# Patient Record
Sex: Female | Born: 1950
Health system: Southern US, Community
[De-identification: ages and names within clinical notes are randomized; demographics above are authoritative.]

## PROBLEM LIST (undated history)

## (undated) DIAGNOSIS — I10 Essential (primary) hypertension: Secondary | ICD-10-CM

## (undated) DIAGNOSIS — M8580 Other specified disorders of bone density and structure, unspecified site: Secondary | ICD-10-CM

## (undated) DIAGNOSIS — E559 Vitamin D deficiency, unspecified: Secondary | ICD-10-CM

## (undated) HISTORY — DX: Vitamin D deficiency, unspecified: E55.9

## (undated) HISTORY — DX: Essential (primary) hypertension: I10

## (undated) HISTORY — DX: Other specified disorders of bone density and structure, unspecified site: M85.80

## (undated) HISTORY — PX: CYST REMOVAL NECK: SHX6281

---

## 1977-02-07 HISTORY — PX: TUBAL LIGATION: SHX77

## 2019-05-08 DIAGNOSIS — E039 Hypothyroidism, unspecified: Secondary | ICD-10-CM | POA: Diagnosis not present

## 2019-05-08 DIAGNOSIS — Z789 Other specified health status: Secondary | ICD-10-CM | POA: Diagnosis not present

## 2019-05-08 DIAGNOSIS — I10 Essential (primary) hypertension: Secondary | ICD-10-CM | POA: Diagnosis not present

## 2019-05-08 DIAGNOSIS — Z299 Encounter for prophylactic measures, unspecified: Secondary | ICD-10-CM | POA: Diagnosis not present

## 2019-07-12 DIAGNOSIS — E039 Hypothyroidism, unspecified: Secondary | ICD-10-CM | POA: Diagnosis not present

## 2019-08-09 DIAGNOSIS — Z299 Encounter for prophylactic measures, unspecified: Secondary | ICD-10-CM | POA: Diagnosis not present

## 2019-08-09 DIAGNOSIS — E0590 Thyrotoxicosis, unspecified without thyrotoxic crisis or storm: Secondary | ICD-10-CM | POA: Diagnosis not present

## 2019-08-09 DIAGNOSIS — I10 Essential (primary) hypertension: Secondary | ICD-10-CM | POA: Diagnosis not present

## 2019-08-09 DIAGNOSIS — M8580 Other specified disorders of bone density and structure, unspecified site: Secondary | ICD-10-CM | POA: Diagnosis not present

## 2019-08-12 DIAGNOSIS — E042 Nontoxic multinodular goiter: Secondary | ICD-10-CM | POA: Diagnosis not present

## 2019-08-12 DIAGNOSIS — E0520 Thyrotoxicosis with toxic multinodular goiter without thyrotoxic crisis or storm: Secondary | ICD-10-CM | POA: Diagnosis not present

## 2019-08-23 DIAGNOSIS — I10 Essential (primary) hypertension: Secondary | ICD-10-CM | POA: Diagnosis not present

## 2019-08-23 DIAGNOSIS — E039 Hypothyroidism, unspecified: Secondary | ICD-10-CM | POA: Diagnosis not present

## 2019-08-23 DIAGNOSIS — E041 Nontoxic single thyroid nodule: Secondary | ICD-10-CM | POA: Diagnosis not present

## 2019-08-23 DIAGNOSIS — Z299 Encounter for prophylactic measures, unspecified: Secondary | ICD-10-CM | POA: Diagnosis not present

## 2019-09-25 ENCOUNTER — Ambulatory Visit: Payer: Medicare Other | Admitting: Nurse Practitioner

## 2019-09-25 ENCOUNTER — Other Ambulatory Visit: Payer: Self-pay

## 2019-09-25 ENCOUNTER — Encounter: Payer: Self-pay | Admitting: Nurse Practitioner

## 2019-09-25 VITALS — BP 134/87 | HR 77 | Ht 65.0 in | Wt 204.8 lb

## 2019-09-25 DIAGNOSIS — E042 Nontoxic multinodular goiter: Secondary | ICD-10-CM | POA: Diagnosis not present

## 2019-09-25 NOTE — Patient Instructions (Signed)
Thyroid Nodule  A thyroid nodule is an isolated growth of thyroid cells that forms a lump in your thyroid gland. The thyroid gland is a butterfly-shaped gland. It is found in the lower front of your neck. This gland sends chemical messengers (hormones) through your blood to all parts of your body. These hormones are important in regulating your body temperature and helping your body to use energy. Thyroid nodules are common. Most are not cancerous (benign). You may have one nodule or several nodules. Different types of thyroid nodules include nodules that:  Grow and fill with fluid (thyroid cysts).  Produce too much thyroid hormone (hot nodules or hyperthyroid).  Produce no thyroid hormone (cold nodules or hypothyroid).  Form from cancer cells (thyroid cancers). What are the causes? In most cases, the cause of this condition is not known. What increases the risk? The following factors may make you more likely to develop this condition.  Age. Thyroid nodules become more common in people who are older than 69 years of age.  Gender. ? Benign thyroid nodules are more common in women. ? Cancerous (malignant) thyroid nodules are more common in men.  A family history that includes: ? Thyroid nodules. ? Pheochromocytoma. ? Thyroid carcinoma. ? Hyperparathyroidism.  Certain kinds of thyroid diseases, such as Hashimoto's thyroiditis.  Lack of iodine in your diet.  A history of head and neck radiation, such as from previous cancer treatment. What are the signs or symptoms? In many cases, there are no symptoms. If you have symptoms, they may include:  A lump in your lower neck.  Feeling a lump or tickle in your throat.  Pain in your neck, jaw, or ear.  Having trouble swallowing. Hot nodules may cause symptoms that include:  Weight loss.  Warm, flushed skin.  Feeling hot.  Feeling nervous.  A racing heartbeat. Cold nodules may cause symptoms that include:  Weight  gain.  Dry skin.  Brittle hair. This may also occur with hair loss.  Feeling cold.  Fatigue. Thyroid cancer nodules may cause symptoms that include:  Hard nodules that feel stuck to the thyroid gland.  Hoarseness.  Lumps in the glands near your thyroid (lymph nodes). How is this diagnosed? A thyroid nodule may be felt by your health care provider during a physical exam. This condition may also be diagnosed based on your symptoms. You may also have tests, including:  An ultrasound. This may be done to confirm the diagnosis.  A biopsy. This involves taking a sample from the nodule and looking at it under a microscope.  Blood tests to make sure that your thyroid is working properly.  A thyroid scan. This test uses a radioactive tracer injected into a vein to create an image of the thyroid gland on a computer screen.  Imaging tests such as MRI or CT scan. These may be done if: ? Your nodule is large. ? Your nodule is blocking your airway. ? Cancer is suspected. How is this treated? Treatment depends on the cause and size of your nodule or nodules. If the nodule is benign, treatment may not be necessary. Your health care provider may monitor the nodule to see if it goes away without treatment. If the nodule continues to grow, is cancerous, or does not go away, treatment may be needed. Treatment may include:  Having a cystic nodule drained with a needle.  Ablation therapy. In this treatment, alcohol is injected into the area of the nodule to destroy the cells. Ablation with heat (  thermal ablation) may also be used.  Radioactive iodine. In this treatment, radioactive iodine is given as a pill or liquid that you drink. This substance causes the thyroid nodule to shrink.  Surgery to remove the nodule. Part or all of your thyroid gland may need to be removed as well.  Medicines. Follow these instructions at home:  Pay attention to any changes in your nodule.  Take  over-the-counter and prescription medicines only as told by your health care provider.  Keep all follow-up visits as told by your health care provider. This is important. Contact a health care provider if:  Your voice changes.  You have trouble swallowing.  You have pain in your neck, ear, or jaw that is getting worse.  Your nodule gets bigger.  Your nodule starts to make it harder for you to breathe.  Your muscles look like they are shrinking (muscle wasting). Get help right away if:  You have chest pain.  There is a loss of consciousness.  You have a sudden fever.  You feel confused.  You are seeing or hearing things that other people do not see or hear (having hallucinations).  You feel very weak.  You have mood swings.  You feel very restless.  You feel suddenly nauseous or throw up.  You suddenly have diarrhea. Summary  A thyroid nodule is an isolated growth of thyroid cells that forms a lump in your thyroid gland.  Thyroid nodules are common. Most are not cancerous (benign). You may have one nodule or several nodules.  Treatment depends on the cause and size of your nodule or nodules. If the nodule is benign, treatment may not be necessary.  Your health care provider may monitor the nodule to see if it goes away without treatment. If the nodule continues to grow, is cancerous, or does not go away, treatment may be needed. This information is not intended to replace advice given to you by your health care provider. Make sure you discuss any questions you have with your health care provider. Document Revised: 09/08/2017 Document Reviewed: 09/11/2017 Elsevier Patient Education  2020 [employer]  

## 2019-09-25 NOTE — Progress Notes (Signed)
09/25/2019     Endocrinology Consult Note    Subjective:    Patient ID: Zoe Webb, female    DOB: Apr 22, 1950, PCP Kirstie Peri, MD.   History reviewed. No pertinent past medical history.  Past Surgical History:  Procedure Laterality Date  . CYST REMOVAL NECK  74-75  . TUBAL LIGATION  1979    Social History   Socioeconomic History  . Marital status: Widowed    Spouse name: Not on file  . Number of children: Not on file  . Years of education: Not on file  . Highest education level: Not on file  Occupational History  . Not on file  Tobacco Use  . Smoking status: Never Smoker  . Smokeless tobacco: Never Used  Substance and Sexual Activity  . Alcohol use: Not Currently  . Drug use: Never  . Sexual activity: Not on file  Other Topics Concern  . Not on file  Social History Narrative  . Not on file   Social Determinants of Health   Financial Resource Strain:   . Difficulty of Paying Living Expenses:   Food Insecurity:   . Worried About Programme researcher, broadcasting/film/video in the Last Year:   . Barista in the Last Year:   Transportation Needs:   . Freight forwarder (Medical):   Marland Kitchen Lack of Transportation (Non-Medical):   Physical Activity:   . Days of Exercise per Week:   . Minutes of Exercise per Session:   Stress:   . Feeling of Stress :   Social Connections:   . Frequency of Communication with Friends and Family:   . Frequency of Social Gatherings with Friends and Family:   . Attends Religious Services:   . Active Member of Clubs or Organizations:   . Attends Banker Meetings:   Marland Kitchen Marital Status:     Family History  Problem Relation Age of Onset  . Cancer Father     Outpatient Encounter Medications as of 09/25/2019  Medication Sig  . alendronate (FOSAMAX) 70 MG tablet Take 70 mg by mouth once a week.  Marland Kitchen lisinopril-hydrochlorothiazide (ZESTORETIC) 20-12.5 MG tablet Take 1 tablet by mouth daily.  . Vitamin D, Ergocalciferol,  (DRISDOL) 1.25 MG (50000 UNIT) CAPS capsule Take 50,000 Units by mouth once a week.   No facility-administered encounter medications on file as of 09/25/2019.    ALLERGIES: Not on File  VACCINATION STATUS:  There is no immunization history on file for this patient.   HPI  Zoe Webb is 69 y.o. female who presents today with a medical history as above. she is being seen in consultation for hyperthyroidism/hypothyroidism as requested by Kirstie Peri, MD.  she has been dealing with symptoms of fatigue, heat intolerance, weight gain, and alternating constipation/diarrhea for approximately 1 year.  There are no recent thyroid labs to review. she denies dysphagia, choking, shortness of breath, no recent voice change.    she does have a family history of thyroid dysfunction in her sister (hypothyroidism), but denies family hx of thyroid cancer. she denies personal history of goiter. she is not on any anti-thyroid medications nor on any thyroid hormone supplements at this time.  She had previously taken levothyroxine, prescribed by her PCP, but was taken off due to fluctuations in her thyroid labs. she  is willing to proceed with appropriate work up and therapy for thyrotoxicosis.  Review of systems  Constitutional: + weight gain, + fatigue, + subjective hyperthermia Eyes: no blurry vision, no xerophthalmia ENT: no sore throat, no nodules palpated in throat, reports difficulty swallowing at times Cardiovascular: no Chest Pain, no Shortness of Breath, no palpitations, no leg swelling Respiratory: no cough, no SOB Gastrointestinal: no Nausea, no Vomiting, + alternating constipation and diarrhea Musculoskeletal: no muscle/joint aches Skin: no rashes Neurological: denies tremors, no numbness, no tingling, no dizziness Psychiatric: no depression, denies anxiety   Objective:    BP 134/87 (BP Location: Right Arm, Patient Position: Sitting)   Pulse 77   Ht 5\' 5"   (1.651 m)   Wt 204 lb 12.8 oz (92.9 kg)   BMI 34.08 kg/m   Wt Readings from Last 3 Encounters:  09/25/19 204 lb 12.8 oz (92.9 kg)                         Physical exam  Constitutional: Body mass index is 34.08 kg/m., not in acute distress, normal state of mind Eyes: PERRLA, EOMI, no exophthalmos ENT: moist mucous membranes, + mild  thyromegaly, no cervical lymphadenopathy Cardiovascular: RRR,  no Murmur/Rubs/Gallops Respiratory:  adequate breathing efforts, no gross chest deformity, Clear to auscultation bilaterally Gastrointestinal: abdomen soft, Non -tender, No distension, Bowel Sounds present Musculoskeletal: no gross deformities, strength intact in all four extremities Skin: moist, warm, no rashes Neurological: + mild tremor with outstretched hands,  adequate Deep Tendon Reflexes on both lower extremities.   CMP  No results found for: NA, K, CL, CO2, GLUCOSE, BUN, CREATININE, CALCIUM, PROT, ALBUMIN, AST, ALT, ALKPHOS, BILITOT, GFRNONAA, GFRAA   CBC No results found for: WBC, RBC, HGB, HCT, PLT, MCV, MCH, MCHC, RDW, LYMPHSABS, MONOABS, EOSABS, BASOSABS   Diabetic Labs (most recent): No results found for: HGBA1C  Lipid Panel  No results found for: CHOL, TRIG, HDL, CHOLHDL, VLDL, LDLCALC, LDLDIRECT, LABVLDL   No results found for: TSH, FREET4    Thyroid ultrasound performed on 08/12/2019 at [facility]:  Right lobe measuring 4.6 x 0.9 x 2.0 cm Left lobe measuring 6.6 x 2.9 x 2.8 cm Estimated total number of nodules greater than or equal to 1 cm: 3  Nodule 1: Location is in the left mid lobe measuring 3.4 x 2.0 x 2.4 cm Composition is solid/almost completely solid Echogenicity is isoechoic Ill-defined margins ACR TI-RADS risk category 3 Given size and appearance, fine-needle aspiration of this mildly suspicious nodule should be considered based on TI-RADS criteria.  Nodule 2: Location is left superior lobe measuring 1.8 x 1 x 1.4 cm Composition is solid/almost  completely solid Echogenicity is hypoechoic Ill-defined margins ACR TI-RADS risk category 4-6 Given size and appearance fine-needle aspiration of this moderately suspicious nodule should be considered based on TI-RADS criteria  Nodule 3: Location is left inferior lobe measuring 1.3 x 1 x 0.8 cm Composition is solid/almost completely solid Echogenicity is hypoechoic Ill-defined margins ACR TI-RADS risk category 4-6 Given size and appearance, a follow-up ultrasound in 1 year should be considered based on TI-RADS criteria  Assessment & Plan:   1. Multinodular goiter  - TSH - T4, free - Thyroid peroxidase antibody - Thyroglobulin antibody - [facility] FNA BX THYROID 1ST LESION AFIRMA - T3, free   she is being seen at the kind request of [place], MD. She was examined clinically. More information is needed to further classify her thyroid dysfunction.  The potential risks of untreated thyrotoxicosis and the need for definitive therapy have been  discussed in detail with her, and she agrees to proceed with diagnostic workup and treatment plan.   I recommended repeating full thyroid profile blood tests today. Given recent thyroid US with multiple suspicious nodules, I also recommend proceeding with fine-needle biopsy of the 2 suspicious nodules in the left lobe.   Options of therapy are discussed with her.  We discussed the option of treating it with medications including methimazole or PTU which may have side effects including rash, transaminitis, and bone marrow suppression.  We  also discussed the option of definitive therapy with RAI ablation of the thyroid.    If  she is found to have primary hyperthyroidism from Graves' disease , toxic multinodular goiter or toxic nodular goiter the preferred modality of treatment would be I-131 thyroid ablation.    -Patient is made aware of the high likelihood of post ablative hypothyroidism with subsequent need for lifelong thyroid hormone replacement.  sheunderstands this outcome  and she is  willing to proceed.  Although surgery is one other choice of treatment in some cases, in her case surgery is not a good fit for presentation with only mild goiter.    she will return in approximately 2 weeks (virtually) to review recent tests and discuss treatment decision.  Her heart rate is controlled today with rate of 77.  Therefore, she will not need treatment with propanolol at this time.  -Patient is advised to maintain close follow up with Kirstie Peri, MD for primary care needs.   - Time spent with the patient: 60 minutes, of which >50% was spent in obtaining information about her symptoms, reviewing her previous labs, evaluations, and treatments, counseling her about her  Thyroid dysfunction , and developing a plan to confirm the diagnosis and long term treatment as necessary. Please refer to " Patient Self Inventory" in the Media  tab for reviewed elements of pertinent patient history.  Zoe Webb participated in the discussions, expressed understanding, and voiced agreement with the above plans.  All questions were answered to her satisfaction. she is encouraged to contact clinic should she have any questions or concerns prior to her return visit.   Follow up plan: Return in about 2 weeks (around 10/09/2019) for Thyroid follow up, labs to be drawn prior to visit, needs thyroid ultrasound with biopsy.   Thank you for involving me in the care of this pleasant patient, and I will continue to update you with her progress.  Ronny Bacon, FNP-BC [facility] Phone: [phone] Fax: [phone]   09/25/2019, 10:39 AM  This note was partially dictated with voice recognition software. Similar sounding words can be transcribed inadequately or may not  be corrected upon review.

## 2019-10-03 DIAGNOSIS — E042 Nontoxic multinodular goiter: Secondary | ICD-10-CM | POA: Diagnosis not present

## 2019-10-04 LAB — THYROGLOBULIN ANTIBODY: Thyroglobulin Antibody: <1 IU/mL (ref 0.0–0.9)

## 2019-10-04 LAB — TSH: TSH: 0.062 u[IU]/mL — ABNORMAL LOW (ref 0.450–4.500)

## 2019-10-04 LAB — T3, FREE: T3, Free: 4.1 pg/mL (ref 2.0–4.4)

## 2019-10-04 LAB — T4, FREE: Free T4: 1.35 ng/dL (ref 0.82–1.77)

## 2019-10-04 LAB — THYROID PEROXIDASE ANTIBODY: Thyroperoxidase Ab SerPl-aCnc: <8 IU/mL (ref 0–34)

## 2019-10-09 ENCOUNTER — Telehealth: Payer: Medicare Other | Admitting: Nurse Practitioner

## 2019-10-10 ENCOUNTER — Encounter (HOSPITAL_COMMUNITY): Payer: Self-pay

## 2019-10-10 ENCOUNTER — Ambulatory Visit (HOSPITAL_COMMUNITY)
Admission: RE | Admit: 2019-10-10 | Discharge: 2019-10-10 | Disposition: A | Discharge status: Home / Self Care | Payer: Medicare Other | Source: Ambulatory Visit | Attending: Nurse Practitioner | Admitting: Nurse Practitioner

## 2019-10-10 ENCOUNTER — Other Ambulatory Visit: Payer: Self-pay | Admitting: Nurse Practitioner

## 2019-10-10 ENCOUNTER — Other Ambulatory Visit: Payer: Self-pay

## 2019-10-10 DIAGNOSIS — E042 Nontoxic multinodular goiter: Secondary | ICD-10-CM | POA: Insufficient documentation

## 2019-10-10 MED ORDER — LIDOCAINE HCL (PF) 2 % IJ SOLN
INTRAMUSCULAR | Status: AC
  Filled 2019-10-10: qty 10

## 2019-10-10 NOTE — Procedures (Signed)
PreOperative Dx: Two indeterminate LT thyroid nodules Postoperative Dx: Two indeterminate LT thyroid nodules Procedure:   US guided FNA of two LT thyroid nodules Radiologist:  Tyron Russell Anesthesia:  7 ml of 2% lidocaine Specimen:  FNA x 5 LT mid nodule, FNA x 4 LT superior nodule  EBL:   < 1 ml Complications: None

## 2019-10-11 ENCOUNTER — Telehealth: Payer: Medicare Other | Admitting: Nurse Practitioner

## 2019-10-15 LAB — CYTOLOGY - NON PAP

## 2019-10-16 ENCOUNTER — Encounter: Payer: Self-pay | Admitting: Nurse Practitioner

## 2019-10-16 ENCOUNTER — Other Ambulatory Visit: Payer: Self-pay

## 2019-10-16 ENCOUNTER — Telehealth (INDEPENDENT_AMBULATORY_CARE_PROVIDER_SITE_OTHER): Payer: Medicare Other | Admitting: Nurse Practitioner

## 2019-10-16 VITALS — BP 134/87 | Ht 65.0 in | Wt 203.0 lb

## 2019-10-16 DIAGNOSIS — E0590 Thyrotoxicosis, unspecified without thyrotoxic crisis or storm: Secondary | ICD-10-CM

## 2019-10-16 DIAGNOSIS — E042 Nontoxic multinodular goiter: Secondary | ICD-10-CM

## 2019-10-16 NOTE — Progress Notes (Signed)
10/16/2019     Endocrinology Consult Note   TELEHEALTH VISIT: The patient is being engaged in telehealth visit due to COVID-19.  This type of visit limits physical examination significantly, and thus is not preferable over face-to-face encounters.  I connected with  Zoe Webb on 10/16/19 by a video enabled telemedicine application and verified that I am speaking with the correct person using two identifiers.   I discussed the limitations of evaluation and management by telemedicine. The patient expressed understanding and agreed to proceed.    The participants involved in this visit include: Dani Gobble, NP located at [facility] and Zoe Webb  located at their personal residence listed.    Subjective:    Patient ID: Zoe Webb, female    DOB: [birth year], PCP Kirstie Peri, MD.   Past Medical History:  Diagnosis Date   Hypertension    Osteopenia    Vitamin D deficiency     Past Surgical History:  Procedure Laterality Date   CYST REMOVAL NECK  74-75   TUBAL LIGATION  1979    Social History   Socioeconomic History   Marital status: Widowed    Spouse name: Not on file   Number of children: Not on file   Years of education: Not on file   Highest education level: Not on file  Occupational History   Not on file  Tobacco Use   Smoking status: Never Smoker   Smokeless tobacco: Never Used  Substance and Sexual Activity   Alcohol use: Not Currently   Drug use: Never   Sexual activity: Not Currently  Other Topics Concern   Not on file  Social History Narrative   Not on file   Social Determinants of Health   Financial Resource Strain:    Difficulty of Paying Living Expenses: Not on file  Food Insecurity:    Worried About Running Out of Food in the Last Year: Not on file   Ran Out of Food in the Last Year: Not on file  Transportation Needs:    Lack of Transportation (Medical): Not on file    Lack of Transportation (Non-Medical): Not on file  Physical Activity:    Days of Exercise per Week: Not on file   Minutes of Exercise per Session: Not on file  Stress:    Feeling of Stress : Not on file  Social Connections:    Frequency of Communication with Friends and Family: Not on file   Frequency of Social Gatherings with Friends and Family: Not on file   Attends Religious Services: Not on file   Active Member of Clubs or Organizations: Not on file   Attends Banker Meetings: Not on file   Marital Status: Not on file    Family History  Problem Relation Age of Onset   Cancer Father    Thyroid disease Sister     Outpatient Encounter Medications as of 10/16/2019  Medication Sig   alendronate (FOSAMAX) 70 MG tablet Take 70 mg by mouth once a week.   calcium carbonate (TUMS - DOSED IN MG ELEMENTAL CALCIUM) 500 MG chewable tablet Chew 1 tablet by mouth daily.   lisinopril-hydrochlorothiazide (ZESTORETIC) 20-12.5 MG tablet Take 1 tablet by mouth daily.   Vitamin D, Ergocalciferol, (DRISDOL) 1.25 MG (50000 UNIT) CAPS capsule Take 50,000 Units by mouth once a week.   No facility-administered encounter medications on file as of 10/16/2019.    ALLERGIES: Not on File  VACCINATION STATUS:  There is no immunization history on file for this patient.   HPI  Zoe DodgeLinda G Nemes is 69 y.o. female who presents today with a medical history as above. she is being seen in follow up after being seen in consultation for hyperthyroidism as requested by Kirstie PeriShah, Ashish, MD.  she has been dealing with symptoms of fatigue, heat intolerance, weight gain, and alternating constipation/diarrhea for approximately 1 year.  Her recent thyroid function tests reviewed. she denies dysphagia, choking, shortness of breath, no recent voice change. She has no new complaints at this time.   she does have a family history of thyroid dysfunction in her sister (hypothyroidism), but denies family  hx of thyroid cancer. she denies personal history of goiter. she is not on any anti-thyroid medications nor on any thyroid hormone supplements at this time.  She had previously taken levothyroxine, prescribed by her PCP, but was taken off due to fluctuations in her thyroid labs.                          Review of systems  Constitutional: + weight gain, + fatigue, + subjective hyperthermia Eyes: no blurry vision, no xerophthalmia ENT: no sore throat, no nodules palpated in throat, reports difficulty swallowing at times Cardiovascular: no Chest Pain, no Shortness of Breath, no palpitations, no leg swelling Respiratory: no cough, no SOB Gastrointestinal: no Nausea, no Vomiting, + alternating constipation and diarrhea Musculoskeletal: no muscle/joint aches Skin: no rashes Neurological: denies tremors, no numbness, no tingling, no dizziness Psychiatric: no depression, + anxiety   Objective:    BP 134/87    Ht 5\' 5"  (1.651 m)    Wt 202 lb (91.6 kg)    BMI 33.61 kg/m   Wt Readings from Last 3 Encounters:  10/16/19 202 lb (91.6 kg)  09/25/19 204 lb 12.8 oz (92.9 kg)    BP Readings from Last 3 Encounters:  10/16/19 134/87  10/10/19 (!) 174/90  09/25/19 134/87     Physical Exam- Telehealth- significantly limited due to nature of visit  Constitutional: Body mass index is 33.61 kg/m. , not in acute distress, normal state of mind Respiratory: Adequate breathing efforts   CMP  No results found for: NA, K, CL, CO2, GLUCOSE, BUN, CREATININE, CALCIUM, PROT, ALBUMIN, AST, ALT, ALKPHOS, BILITOT, GFRNONAA, GFRAA   CBC No results found for: WBC, RBC, HGB, HCT, PLT, MCV, MCH, MCHC, RDW, LYMPHSABS, MONOABS, EOSABS, BASOSABS   Diabetic Labs (most recent): No results found for: HGBA1C  Lipid Panel  No results found for: CHOL, TRIG, HDL, CHOLHDL, VLDL, LDLCALC, LDLDIRECT, LABVLDL   Lab Results  Component Value Date   TSH 0.062 (L) 10/03/2019   FREET4 1.35 10/03/2019      Thyroid  [facility]:  Right lobe measuring 4.6 x 0.9 x 2.0 cm Left lobe measuring 6.6 x 2.9 x 2.8 cm Estimated total number of nodules greater than or equal to 1 cm: 3  Nodule 1: Location is in the left mid lobe measuring 3.4 x 2.0 x 2.4 cm Composition is solid/almost completely solid Echogenicity is isoechoic Ill-defined margins ACR TI-RADS risk category 3 Given size and appearance, fine-needle aspiration of this mildly suspicious nodule should be considered based on TI-RADS criteria.  Nodule 2: Location is left superior lobe measuring 1.8 x 1 x 1.4 cm Composition is solid/almost completely solid Echogenicity is hypoechoic Ill-defined margins ACR TI-RADS risk category 4-6 Given size and appearance fine-needle aspiration of this moderately  suspicious nodule should be considered based on TI-RADS criteria  Nodule 3: Location is left inferior lobe measuring 1.3 x 1 x 0.8 cm Composition is solid/almost completely solid Echogenicity is hypoechoic Ill-defined margins ACR TI-RADS risk category 4-6 Given size and appearance, a follow-up ultrasound in 1 year should be considered based on TI-RADS criteria   Pathology Report: 10/10/19 NODULE 1: Clinical History: 3.4 cm LML  Specimen Submitted: A. THYROID, LEFT MID, FINE NEEDLE ASPIRATION:   FINAL MICROSCOPIC DIAGNOSIS:  - Consistent with benign follicular nodule (Bethesda category II)   SPECIMEN ADEQUACY:  Satisfactory for evaluation   GROSS:  Received is/are 3 slides in 95% Ethyl alcohol, 3 air dried slides for  diff stain, and 30 ccs of pale pink Cytolyt solution. (CM:cm)  Prepared:  Smears: 6  Concentration Method (Thin Prep): 1  Cell Block: Cell block attempted, not obtained.  Additional Studies: Also there was an Afirma collected.  NODULE 2: Clinical History: 1.8 cm LUL  Specimen Submitted: A. THYROID, LEFT SUPERIOR, FINE NEEDLE ASPIRATION:    FINAL MICROSCOPIC DIAGNOSIS:  - Consistent  with benign follicular nodule (Bethesda category II)   SPECIMEN ADEQUACY:  Satisfactory for evaluation   GROSS:  Received is/are 3 slides in 95% Ethyl alcohol, 3 air dried slides for  diff stain, and 30 ccs of clear colorless Cytolyt solution. (CM:cm)  Prepared:  Smears: 6  Concentration Method (Thin Prep): 1  Cell Block: Cell block attempted, not obtained.  Additional Studies: Also there was an Afirma collected.  Assessment & Plan:   1. Multinodular goiter 2. Subclinical Hyperthyroidism  The biopsy of the two suspicious nodules show benign follicular nodules.  The potential risks of untreated thyrotoxicosis and the need for definitive therapy have been discussed in detail with her, and she agrees to proceed with diagnostic workup and treatment plan.   Now that biopsy results show benign pathology, will proceed with uptake and scan to determine definitive plan. Options of therapy are discussed with her.  We discussed the option of treating it with medications including methimazole or PTU which may have side effects including rash, transaminitis, and bone marrow suppression.  We also discussed the option of definitive therapy with RAI ablation of the thyroid if treatment with medications fails.    If she is found to have primary hyperthyroidism from Graves' disease, toxic multinodular goiter or toxic nodular goiter the preferred modality of treatment would be I-131 thyroid ablation.    -Patient is made aware of the high likelihood of post ablative hypothyroidism with subsequent need for lifelong thyroid hormone replacement. she understands this outcome  and she is willing to proceed.      she will return in approximately 2 weeks (virtually) to discuss results of uptake and scan and treatment decision.  -Patient is advised to maintain close follow up with Kirstie Peri, MD for primary care needs.  -I spent 20 minutes dedicated to the care of this patient on the date of this  encounter to include pre-visit review of records, face-to-face time with the patient, and post visit ordering of  testing.  Zoe Webb participated in the discussions, expressed understanding, and voiced agreement with the above plans.  All questions were answered to her satisfaction. she is encouraged to contact clinic should she have any questions or concerns prior to her return visit.   Follow up plan: Return in about 2 weeks (around 10/30/2019) for Thyroid follow up: discuss Uptake and Scan.   Ronny Bacon, FNP-BC [facility] Phone: [phone]  Fax: [phone]   10/16/2019, 9:06 AM  This note was partially dictated with voice recognition software. Similar sounding words can be transcribed inadequately or may not  be corrected upon review.

## 2019-10-16 NOTE — Patient Instructions (Signed)

## 2019-10-28 ENCOUNTER — Other Ambulatory Visit: Payer: Self-pay

## 2019-10-28 ENCOUNTER — Encounter (HOSPITAL_COMMUNITY)
Admission: RE | Admit: 2019-10-28 | Discharge: 2019-10-28 | Disposition: A | Discharge status: Home / Self Care | Payer: Medicare Other | Source: Ambulatory Visit | Attending: Nurse Practitioner | Admitting: Nurse Practitioner

## 2019-10-28 DIAGNOSIS — E042 Nontoxic multinodular goiter: Secondary | ICD-10-CM | POA: Diagnosis not present

## 2019-10-28 DIAGNOSIS — E0590 Thyrotoxicosis, unspecified without thyrotoxic crisis or storm: Secondary | ICD-10-CM | POA: Insufficient documentation

## 2019-10-28 MED ORDER — SODIUM IODIDE I-123 7.4 MBQ CAPS
460.0000 | ORAL_CAPSULE | Freq: Once | ORAL | Status: AC
  Administered 2019-10-28: 460 via ORAL

## 2019-10-29 ENCOUNTER — Encounter (HOSPITAL_COMMUNITY)
Admission: RE | Admit: 2019-10-29 | Discharge: 2019-10-29 | Disposition: A | Discharge status: Home / Self Care | Payer: Medicare Other | Source: Ambulatory Visit | Attending: Nurse Practitioner | Admitting: Nurse Practitioner

## 2019-10-29 DIAGNOSIS — E0590 Thyrotoxicosis, unspecified without thyrotoxic crisis or storm: Secondary | ICD-10-CM | POA: Diagnosis not present

## 2019-10-30 ENCOUNTER — Telehealth (INDEPENDENT_AMBULATORY_CARE_PROVIDER_SITE_OTHER): Payer: Medicare Other | Admitting: Nurse Practitioner

## 2019-10-30 ENCOUNTER — Encounter: Payer: Self-pay | Admitting: Nurse Practitioner

## 2019-10-30 DIAGNOSIS — E0590 Thyrotoxicosis, unspecified without thyrotoxic crisis or storm: Secondary | ICD-10-CM | POA: Diagnosis not present

## 2019-10-30 NOTE — Progress Notes (Signed)
10/30/2019     Endocrinology Consult Note   TELEHEALTH VISIT: The patient is being engaged in telehealth visit due to COVID-19.  This type of visit limits physical examination significantly, and thus is not preferable over face-to-face encounters.  I connected with  Zoe Webb on 10/30/19 by a video enabled telemedicine application and verified that I am speaking with the correct person using two identifiers.   I discussed the limitations of evaluation and management by telemedicine. The patient expressed understanding and agreed to proceed.    The participants involved in this visit include: Dani Gobble, NP located at [facility] and Zoe Webb  located at their personal residence listed.    Subjective:    Patient ID: Zoe Webb, female    DOB: 1950/04/01, PCP Kirstie Peri, MD.   Past Medical History:  Diagnosis Date  . Hypertension   . Osteopenia   . Vitamin D deficiency     Past Surgical History:  Procedure Laterality Date  . CYST REMOVAL NECK  74-75  . TUBAL LIGATION  1979    Social History   Socioeconomic History  . Marital status: Widowed    Spouse name: Not on file  . Number of children: Not on file  . Years of education: Not on file  . Highest education level: Not on file  Occupational History  . Not on file  Tobacco Use  . Smoking status: Never Smoker  . Smokeless tobacco: Never Used  Substance and Sexual Activity  . Alcohol use: Not Currently  . Drug use: Never  . Sexual activity: Not Currently  Other Topics Concern  . Not on file  Social History Narrative  . Not on file   Social Determinants of Health   Financial Resource Strain:   . Difficulty of Paying Living Expenses: Not on file  Food Insecurity:   . Worried About Programme researcher, broadcasting/film/video in the Last Year: Not on file  . Ran Out of Food in the Last Year: Not on file  Transportation Needs:   . Lack of Transportation (Medical): Not on file  .  Lack of Transportation (Non-Medical): Not on file  Physical Activity:   . Days of Exercise per Week: Not on file  . Minutes of Exercise per Session: Not on file  Stress:   . Feeling of Stress : Not on file  Social Connections:   . Frequency of Communication with Friends and Family: Not on file  . Frequency of Social Gatherings with Friends and Family: Not on file  . Attends Religious Services: Not on file  . Active Member of Clubs or Organizations: Not on file  . Attends Banker Meetings: Not on file  . Marital Status: Not on file    Family History  Problem Relation Age of Onset  . Cancer Father   . Thyroid disease Sister     Outpatient Encounter Medications as of 10/30/2019  Medication Sig  . alendronate (FOSAMAX) 70 MG tablet Take 70 mg by mouth once a week.  . calcium carbonate (TUMS - DOSED IN MG ELEMENTAL CALCIUM) 500 MG chewable tablet Chew 1 tablet by mouth daily.  Marland Kitchen lisinopril-hydrochlorothiazide (ZESTORETIC) 20-12.5 MG tablet Take 1 tablet by mouth daily.  . Vitamin D, Ergocalciferol, (DRISDOL) 1.25 MG (50000 UNIT) CAPS capsule Take 50,000 Units by mouth once a week.   No facility-administered encounter medications on file as of 10/30/2019.    ALLERGIES: No Known Allergies  VACCINATION STATUS:  There is no immunization history on file for this patient.   HPI  Zoe Webb is 69 y.o. female who presents today with a medical history as above. she is being seen in follow up after being seen in consultation for hyperthyroidism as requested by Kirstie Peri, MD.  she has been dealing with symptoms of fatigue, heat intolerance, weight gain, and alternating constipation/diarrhea for approximately 1 year.  Her recent thyroid function tests reviewed. she denies dysphagia, choking, shortness of breath, no recent voice change. She has no new complaints at this time and reports some improvements in some of her symptoms without intervention.   she does have a  family history of thyroid dysfunction in her sister (hypothyroidism), but denies family hx of thyroid cancer. she denies personal history of goiter. she is not on any anti-thyroid medications nor on any thyroid hormone supplements at this time.  She had previously taken levothyroxine, prescribed by her PCP, but was taken off due to fluctuations in her thyroid labs.                          Review of systems  Constitutional: + weight gain, + fatigue, + subjective hyperthermia Eyes: no blurry vision, no xerophthalmia ENT: no sore throat, no nodules palpated in throat, reports difficulty swallowing at times Cardiovascular: no Chest Pain, no Shortness of Breath, no palpitations, no leg swelling Respiratory: no cough, no SOB Gastrointestinal: no Nausea, no Vomiting, + alternating constipation and diarrhea Musculoskeletal: no muscle/joint aches Skin: no rashes Neurological: denies tremors, no numbness, no tingling, no dizziness Psychiatric: no depression, no anxiety   Objective:    There were no vitals taken for this visit.  Wt Readings from Last 3 Encounters:  10/16/19 203 lb (92.1 kg)  09/25/19 204 lb 12.8 oz (92.9 kg)    BP Readings from Last 3 Encounters:  10/16/19 134/87  10/10/19 (!) 174/90  09/25/19 134/87     Physical Exam- Telehealth- significantly limited due to nature of visit  Constitutional: There is no height or weight on file to calculate BMI. , not in acute distress, normal state of mind Respiratory: Adequate breathing efforts   CMP  No results found for: NA, K, CL, CO2, GLUCOSE, BUN, CREATININE, CALCIUM, PROT, ALBUMIN, AST, ALT, ALKPHOS, BILITOT, GFRNONAA, GFRAA   CBC No results found for: WBC, RBC, HGB, HCT, PLT, MCV, MCH, MCHC, RDW, LYMPHSABS, MONOABS, EOSABS, BASOSABS   Diabetic Labs (most recent): No results found for: HGBA1C  Lipid Panel  No results found for: CHOL, TRIG, HDL, CHOLHDL, VLDL, LDLCALC, LDLDIRECT, LABVLDL   Lab Results  Component Value  Date   TSH 0.062 (L) 10/03/2019   FREET4 1.35 10/03/2019      Thyroid ultrasound performed on 08/12/2019 at [facility]:  Right lobe measuring 4.6 x 0.9 x 2.0 cm Left lobe measuring 6.6 x 2.9 x 2.8 cm Estimated total number of nodules greater than or equal to 1 cm: 3  Nodule 1: Location is in the left mid lobe measuring 3.4 x 2.0 x 2.4 cm Composition is solid/almost completely solid Echogenicity is isoechoic Ill-defined margins ACR TI-RADS risk category 3 Given size and appearance, fine-needle aspiration of this mildly suspicious nodule should be considered based on TI-RADS criteria.  Nodule 2: Location is left superior lobe measuring 1.8 x 1 x 1.4 cm Composition is solid/almost completely solid Echogenicity is hypoechoic Ill-defined margins ACR TI-RADS risk category 4-6 Given size and appearance fine-needle aspiration of this moderately suspicious  nodule should be considered based on TI-RADS criteria  Nodule 3: Location is left inferior lobe measuring 1.3 x 1 x 0.8 cm Composition is solid/almost completely solid Echogenicity is hypoechoic Ill-defined margins ACR TI-RADS risk category 4-6 Given size and appearance, a follow-up ultrasound in 1 year should be considered based on TI-RADS criteria --------------------------------------------------------------------------------------------------------------  Pathology Report: 10/10/19 NODULE 1: Clinical History: 3.4 cm LML  Specimen Submitted: A. THYROID, LEFT MID, FINE NEEDLE ASPIRATION:   FINAL MICROSCOPIC DIAGNOSIS:  - Consistent with benign follicular nodule (Bethesda category II)   SPECIMEN ADEQUACY:  Satisfactory for evaluation   GROSS:  Received is/are 3 slides in 95% Ethyl alcohol, 3 air dried slides for  diff stain, and 30 ccs of pale pink Cytolyt solution. (CM:cm)  Prepared:  Smears: 6  Concentration Method (Thin Prep): 1  Cell Block: Cell block attempted, not obtained.  Additional Studies: Also there was an  Afirma collected.  NODULE 2: Clinical History: 1.8 cm LUL  Specimen Submitted: A. THYROID, LEFT SUPERIOR, FINE NEEDLE ASPIRATION:    FINAL MICROSCOPIC DIAGNOSIS:  - Consistent with benign follicular nodule (Bethesda category II)   SPECIMEN ADEQUACY:  Satisfactory for evaluation   GROSS:  Received is/are 3 slides in 95% Ethyl alcohol, 3 air dried slides for  diff stain, and 30 ccs of clear colorless Cytolyt solution. (CM:cm)  Prepared:  Smears: 6  Concentration Method (Thin Prep): 1  Cell Block: Cell block attempted, not obtained.  Additional Studies: Also there was an Afirma collected.  ------------------------------------------------------------------------------------------------- Uptake and Scan results from 10/29/19 EXAM: THYROID SCAN AND UPTAKE - 4 AND 24 HOURS  TECHNIQUE: Following oral administration of I-123 capsule, anterior planar imaging was acquired at 24 hours. Thyroid uptake was calculated with a thyroid probe at 4-6 hours and 24 hours.  RADIOPHARMACEUTICALS:  460.0 uCi I-123 sodium iodide p.o.  COMPARISON:  Thyroid ultrasound 08/12/2019  FINDINGS: Minimal thyroid heterogeneity without focal hot or cold nodules.  4 hour I-123 uptake = 14.1% (normal 5-20%)  24 hour I-123 uptake = 26.0% (normal 10-30%)  IMPRESSION: 1. Normal radioactive iodine uptake at 4 and 24 hours. 2. Minimally heterogeneous appearance of the gland without focal hot or cold nodules. 3. Based on prior ultrasound appearance, findings could reflect subacute thyroiditis or mild/early toxic multinodular goiter.  Assessment & Plan:   1. Multinodular goiter 2. Subclinical Hyperthyroidism  The biopsy of the two suspicious nodules show benign follicular nodules.  Will plan to repeat thyroid US in 1 year.  -Her uptake and scan was normal, indicating that she is likely recovering from thyroiditis.  The decrease in her symptoms also suggest this.  Will plan to repeat TFTs in 4  months.    -Patient is advised to maintain close follow up with Kirstie Peri, MD for primary care needs.  -I spent 20 minutes dedicated to the care of this patient on the date of this encounter to include pre-visit review of records, face-to-face time with the patient, and post visit ordering of  testing.  Zoe Webb participated in the discussions, expressed understanding, and voiced agreement with the above plans.  All questions were answered to her satisfaction. she is encouraged to contact clinic should she have any questions or concerns prior to her return visit.   Follow up plan: Return in about 4 months (around 02/29/2020) for Thyroid follow up, Previsit labs, Virtual visit ok.   Ronny Bacon, FNP-BC [facility] Phone: [phone] Fax: [phone]   10/30/2019, 10:32 AM  This note was partially dictated  with voice recognition software. Similar sounding words can be transcribed inadequately or may not  be corrected upon review.

## 2019-11-12 DIAGNOSIS — Z0000 Encounter for general adult medical examination without abnormal findings: Secondary | ICD-10-CM | POA: Diagnosis not present

## 2019-11-12 DIAGNOSIS — I10 Essential (primary) hypertension: Secondary | ICD-10-CM | POA: Diagnosis not present

## 2019-11-12 DIAGNOSIS — Z7189 Other specified counseling: Secondary | ICD-10-CM | POA: Diagnosis not present

## 2019-11-12 DIAGNOSIS — Z299 Encounter for prophylactic measures, unspecified: Secondary | ICD-10-CM | POA: Diagnosis not present

## 2019-11-13 DIAGNOSIS — Z79899 Other long term (current) drug therapy: Secondary | ICD-10-CM | POA: Diagnosis not present

## 2019-11-13 DIAGNOSIS — R5383 Other fatigue: Secondary | ICD-10-CM | POA: Diagnosis not present

## 2019-11-13 DIAGNOSIS — E039 Hypothyroidism, unspecified: Secondary | ICD-10-CM | POA: Diagnosis not present

## 2019-11-29 DIAGNOSIS — Z23 Encounter for immunization: Secondary | ICD-10-CM | POA: Diagnosis not present

## 2019-11-29 DIAGNOSIS — Z299 Encounter for prophylactic measures, unspecified: Secondary | ICD-10-CM | POA: Diagnosis not present

## 2019-11-29 DIAGNOSIS — E039 Hypothyroidism, unspecified: Secondary | ICD-10-CM | POA: Diagnosis not present

## 2019-11-29 DIAGNOSIS — I10 Essential (primary) hypertension: Secondary | ICD-10-CM | POA: Diagnosis not present

## 2019-11-29 DIAGNOSIS — B029 Zoster without complications: Secondary | ICD-10-CM | POA: Diagnosis not present

## 2019-12-07 ENCOUNTER — Encounter: Payer: Self-pay | Admitting: Nurse Practitioner

## 2019-12-09 NOTE — Telephone Encounter (Signed)
I planned to have her complete her next round of labs before initiating any medication since her thyroid uptake and scan was negative and her thyroid US indicated improvement in inflammation.  Is she having andy new symptoms?  Palpitations? Tremors?  Have her other symptoms improved or worsened?

## 2020-02-26 DIAGNOSIS — E0590 Thyrotoxicosis, unspecified without thyrotoxic crisis or storm: Secondary | ICD-10-CM | POA: Diagnosis not present

## 2020-02-27 LAB — TSH: TSH: 0.035 u[IU]/mL — ABNORMAL LOW (ref 0.450–4.500)

## 2020-02-27 LAB — T3, FREE: T3, Free: 4.1 pg/mL (ref 2.0–4.4)

## 2020-02-27 LAB — T4, FREE: Free T4: 1.52 ng/dL (ref 0.82–1.77)

## 2020-03-03 ENCOUNTER — Other Ambulatory Visit: Payer: Self-pay

## 2020-03-03 ENCOUNTER — Ambulatory Visit: Payer: Medicare Other | Admitting: Nurse Practitioner

## 2020-03-03 ENCOUNTER — Encounter: Payer: Self-pay | Admitting: Nurse Practitioner

## 2020-03-03 VITALS — BP 138/83 | HR 85 | Ht 65.0 in | Wt 202.2 lb

## 2020-03-03 DIAGNOSIS — E0590 Thyrotoxicosis, unspecified without thyrotoxic crisis or storm: Secondary | ICD-10-CM

## 2020-03-03 DIAGNOSIS — E042 Nontoxic multinodular goiter: Secondary | ICD-10-CM

## 2020-03-03 MED ORDER — METHIMAZOLE 5 MG PO TABS: 5.0000 mg | ORAL_TABLET | Freq: Every day | ORAL | 3 refills | Status: DC

## 2020-03-03 NOTE — Progress Notes (Signed)
03/03/2020     Endocrinology Follow Up Visit   Subjective:    Patient ID: Zoe Webb, female    DOB: 04-Sep-1950, PCP Zoe Peri, MD.   Past Medical History:  Diagnosis Date  . Hypertension   . Osteopenia   . Vitamin D deficiency     Past Surgical History:  Procedure Laterality Date  . CYST REMOVAL NECK  74-75  . TUBAL LIGATION  1979    Social History   Socioeconomic History  . Marital status: Widowed    Spouse name: Not on file  . Number of children: Not on file  . Years of education: Not on file  . Highest education level: Not on file  Occupational History  . Not on file  Tobacco Use  . Smoking status: Never Smoker  . Smokeless tobacco: Never Used  Substance and Sexual Activity  . Alcohol use: Not Currently  . Drug use: Never  . Sexual activity: Not Currently  Other Topics Concern  . Not on file  Social History Narrative  . Not on file   Social Determinants of Health   Financial Resource Strain: Not on file  Food Insecurity: Not on file  Transportation Needs: Not on file  Physical Activity: Not on file  Stress: Not on file  Social Connections: Not on file    Family History  Problem Relation Age of Onset  . Cancer Father   . Thyroid disease Sister     Outpatient Encounter Medications as of 03/03/2020  Medication Sig  . alendronate (FOSAMAX) 70 MG tablet Take 70 mg by mouth once a week.  . calcium carbonate (TUMS - DOSED IN MG ELEMENTAL CALCIUM) 500 MG chewable tablet Chew 1 tablet by mouth daily.  Marland Kitchen lisinopril-hydrochlorothiazide (ZESTORETIC) 20-12.5 MG tablet Take 1 tablet by mouth daily.  . methimazole (TAPAZOLE) 5 MG tablet Take 1 tablet (5 mg total) by mouth daily.  . Vitamin D, Ergocalciferol, (DRISDOL) 1.25 MG (50000 UNIT) CAPS capsule Take 50,000 Units by mouth once a week.   No facility-administered encounter medications on file as of 03/03/2020.    ALLERGIES: No Known Allergies  VACCINATION STATUS:  There is no  immunization history on file for this patient.   HPI  Zoe Webb is 70 y.o. female who presents today with a medical history as above. she is being seen in follow up after being seen in consultation for hyperthyroidism as requested by Zoe Peri, MD.  she has been dealing with symptoms of fatigue, heat intolerance, weight gain, and alternating constipation/diarrhea for approximately 1 year.  Her recent thyroid function tests reviewed. she denies dysphagia, choking, shortness of breath, no recent voice change. She has no new complaints at this time and reports some improvements in some of her symptoms without intervention.   she does have a family history of thyroid dysfunction in her sister (hypothyroidism), but denies family hx of thyroid cancer. she denies personal history of goiter. she is not on any anti-thyroid medications nor on any thyroid hormone supplements at this time.  She had previously taken levothyroxine, prescribed by her PCP, but was taken off due to fluctuations in her thyroid labs.  Review of systems  Constitutional: + steady weight, + fatigue, no subjective hyperthermia Eyes: no blurry vision, no xerophthalmia ENT: no sore throat, no nodules palpated in throat, reports difficulty swallowing at times Cardiovascular: no Chest Pain, no Shortness of Breath, + intermittent palpitations, no leg swelling Respiratory: no cough, no SOB Gastrointestinal: no Nausea,  no Vomiting, + alternating constipation and diarrhea Musculoskeletal: no muscle/joint aches Skin: no rashes Neurological: denies tremors, no numbness, no tingling, no dizziness Psychiatric: no depression, + anxiety (she attributes this to stress)   Objective:    BP 138/83 (BP Location: Left Arm, Patient Position: Sitting)   Pulse 85   Ht 5\' 5"  (1.651 m)   Wt 202 lb 3.2 oz (91.7 kg)   BMI 33.65 kg/m   Wt Readings from Last 3 Encounters:  03/03/20 202 lb 3.2 oz (91.7 kg)  10/16/19 203 lb (92.1 kg)   09/25/19 204 lb 12.8 oz (92.9 kg)    BP Readings from Last 3 Encounters:  03/03/20 138/83  10/16/19 134/87  10/10/19 (!) 174/90     Physical Exam- Limited  Constitutional:  Body mass index is 33.65 kg/m. , not in acute distress, normal state of mind Eyes:  EOMI, no exophthalmos Neck: Supple Cardiovascular: RRR, no murmers, rubs, or gallops, no edema Respiratory: Adequate breathing efforts, no crackles, rales, rhonchi, or wheezing Musculoskeletal: no gross deformities, strength intact in all four extremities, no gross restriction of joint movements Skin:  no rashes, no hyperemia Neurological: no tremor with outstretched hands   CMP  No results found for: NA, K, CL, CO2, GLUCOSE, BUN, CREATININE, CALCIUM, PROT, ALBUMIN, AST, ALT, ALKPHOS, BILITOT, GFRNONAA, GFRAA   CBC No results found for: WBC, RBC, HGB, HCT, PLT, MCV, MCH, MCHC, RDW, LYMPHSABS, MONOABS, EOSABS, BASOSABS   Diabetic Labs (most recent): No results found for: HGBA1C  Lipid Panel  No results found for: CHOL, TRIG, HDL, CHOLHDL, VLDL, LDLCALC, LDLDIRECT, LABVLDL   Lab Results  Component Value Date   TSH 0.035 (L) 02/26/2020   TSH 0.062 (L) 10/03/2019   FREET4 1.52 02/26/2020   FREET4 1.35 10/03/2019      Thyroid ultrasound performed on 08/12/2019 at [facility]:  Right lobe measuring 4.6 x 0.9 x 2.0 cm Left lobe measuring 6.6 x 2.9 x 2.8 cm Estimated total number of nodules greater than or equal to 1 cm: 3  Nodule 1: Location is in the left mid lobe measuring 3.4 x 2.0 x 2.4 cm Composition is solid/almost completely solid Echogenicity is isoechoic Ill-defined margins ACR TI-RADS risk category 3 Given size and appearance, fine-needle aspiration of this mildly suspicious nodule should be considered based on TI-RADS criteria.  Nodule 2: Location is left superior lobe measuring 1.8 x 1 x 1.4 cm Composition is solid/almost completely solid Echogenicity is hypoechoic Ill-defined margins ACR TI-RADS  risk category 4-6 Given size and appearance fine-needle aspiration of this moderately suspicious nodule should be considered based on TI-RADS criteria  Nodule 3: Location is left inferior lobe measuring 1.3 x 1 x 0.8 cm Composition is solid/almost completely solid Echogenicity is hypoechoic Ill-defined margins ACR TI-RADS risk category 4-6 Given size and appearance, a follow-up ultrasound in 1 year should be considered based on TI-RADS criteria --------------------------------------------------------------------------------------------------------------  Pathology Report: 10/10/19 NODULE 1: Clinical History: 3.4 cm LML  Specimen Submitted: A. THYROID, LEFT MID, FINE NEEDLE ASPIRATION:   FINAL MICROSCOPIC DIAGNOSIS:  - Consistent with benign follicular nodule (Bethesda category II)   SPECIMEN ADEQUACY:  Satisfactory for evaluation   GROSS:  Received is/are 3 slides in 95% Ethyl alcohol, 3 air dried slides for  diff stain, and 30 ccs of pale pink Cytolyt solution. (CM:cm)  Prepared:  Smears: 6  Concentration Method (Thin Prep): 1  Cell Block: Cell block attempted, not obtained.  Additional Studies: Also there was an Afirma collected.  NODULE 2: Clinical  History: 1.8 cm LUL  Specimen Submitted: A. THYROID, LEFT SUPERIOR, FINE NEEDLE ASPIRATION:    FINAL MICROSCOPIC DIAGNOSIS:  - Consistent with benign follicular nodule (Bethesda category II)   SPECIMEN ADEQUACY:  Satisfactory for evaluation   GROSS:  Received is/are 3 slides in 95% Ethyl alcohol, 3 air dried slides for  diff stain, and 30 ccs of clear colorless Cytolyt solution. (CM:cm)  Prepared:  Smears: 6  Concentration Method (Thin Prep): 1  Cell Block: Cell block attempted, not obtained.  Additional Studies: Also there was an Afirma collected.  ------------------------------------------------------------------------------------------------- Uptake and Scan results from 10/29/19 EXAM: THYROID SCAN AND  UPTAKE - 4 AND 24 HOURS  TECHNIQUE: Following oral administration of I-123 capsule, anterior planar imaging was acquired at 24 hours. Thyroid uptake was calculated with a thyroid probe at 4-6 hours and 24 hours.  RADIOPHARMACEUTICALS:  460.0 uCi I-123 sodium iodide p.o.  COMPARISON:  Thyroid ultrasound 08/12/2019  FINDINGS: Minimal thyroid heterogeneity without focal hot or cold nodules.  4 hour I-123 uptake = 14.1% (normal 5-20%)  24 hour I-123 uptake = 26.0% (normal 10-30%)  IMPRESSION: 1. Normal radioactive iodine uptake at 4 and 24 hours. 2. Minimally heterogeneous appearance of the gland without focal hot or cold nodules. 3. Based on prior ultrasound appearance, findings could reflect subacute thyroiditis or mild/early toxic multinodular goiter.  Assessment & Plan:   1. Multinodular goiter The biopsy of the two suspicious nodules show benign follicular nodules.  Will plan to repeat thyroid US in 1 year.  2. Subclinical Hyperthyroidism -Her previsit thyroid function tests show worsening TSH suppression (more than her initial visit) indicating continued state of mild hyperthyroidism.  Given her current mild symptoms, she was approached for treatment with Methimazole 5 mg po daily.  I do not feel that she needs RAI ablation at this point, however if she does not respond favorably to Methimazole, that option will be considered.  Will recheck thyroid function tests prior to next visit in 3 months.  -Her uptake and scan was high normal, indicating that she is has mild hyperthyroidism or is recovering from thyroiditis.    -Patient is advised to maintain close follow up with Zoe Peri, MD for primary care needs.     - Time spent on this patient care encounter:  20 minutes of which 50% was spent in  counseling and the rest reviewing  her current and  previous labs / studies and medications  doses and developing a plan for long term care. Zoe Webb  participated in  the discussions, expressed understanding, and voiced agreement with the above plans.  All questions were answered to her satisfaction. she is encouraged to contact clinic should she have any questions or concerns prior to her return visit.   Follow up plan: Return in about 3 months (around 06/01/2020) for Thyroid follow up, Previsit labs.   Ronny Bacon, [facility] [address] [place], [place] [address] Phone: [phone] Fax: [phone]   03/03/2020, 10:35 AM

## 2020-03-03 NOTE — Patient Instructions (Signed)

## 2020-03-26 ENCOUNTER — Telehealth: Payer: Self-pay | Admitting: Nurse Practitioner

## 2020-03-26 NOTE — Telephone Encounter (Signed)
Please advise 

## 2020-03-26 NOTE — Telephone Encounter (Signed)
There are no major contraindications that she couldn't take that supplement along with her Methimazole.  That should be fine.

## 2020-03-26 NOTE — Telephone Encounter (Signed)
Returned call to patient to advise, no answer, I left VM.

## 2020-03-26 NOTE — Telephone Encounter (Signed)
Pt called and would like to know if she can take Super C with Vit D and Zinc while also taking methimazole (TAPAZOLE) 5 MG tablet

## 2020-05-27 DIAGNOSIS — E0590 Thyrotoxicosis, unspecified without thyrotoxic crisis or storm: Secondary | ICD-10-CM | POA: Diagnosis not present

## 2020-05-28 LAB — TSH: TSH: 0.821 u[IU]/mL (ref 0.450–4.500)

## 2020-05-28 LAB — T3, FREE: T3, Free: 3.5 pg/mL (ref 2.0–4.4)

## 2020-05-28 LAB — T4, FREE: Free T4: 1.39 ng/dL (ref 0.82–1.77)

## 2020-05-29 ENCOUNTER — Other Ambulatory Visit: Payer: Self-pay | Admitting: Nurse Practitioner

## 2020-06-01 ENCOUNTER — Ambulatory Visit: Payer: Medicare Other | Admitting: Nurse Practitioner

## 2020-06-01 ENCOUNTER — Encounter: Payer: Self-pay | Admitting: Nurse Practitioner

## 2020-06-01 ENCOUNTER — Other Ambulatory Visit: Payer: Self-pay

## 2020-06-01 VITALS — BP 118/76 | HR 66 | Ht 65.0 in | Wt 195.6 lb

## 2020-06-01 DIAGNOSIS — E0590 Thyrotoxicosis, unspecified without thyrotoxic crisis or storm: Secondary | ICD-10-CM

## 2020-06-01 DIAGNOSIS — E042 Nontoxic multinodular goiter: Secondary | ICD-10-CM | POA: Diagnosis not present

## 2020-06-01 NOTE — Patient Instructions (Signed)
Methimazole tablets What is this medicine? METHIMAZOLE (meth IM a zole) lowers the amount of thyroid hormone made by the thyroid gland. It treats hyperthyroidism (where the thyroid gland makes too much hormone). It also is used before thyroid surgery or radioactive iodine treatment. This medicine may be used for other purposes; ask your health care provider or pharmacist if you have questions. COMMON BRAND NAME(S): Northyx, Tapazole What should I tell my health care provider before I take this medicine? They need to know if you have any of these conditions:  liver disease  low blood counts, like low white cell counts  an unusual or allergic reaction to methimazole, other medicines, foods, dyes, or preservatives  pregnant or trying to get pregnant  breast-feeding How should I use this medicine? Take this medicine by mouth with a glass of water. Follow the directions on the prescription label. You can take this medicine with or without food. However, you should always take it the same way to make sure the effects are the same. Take your doses at regular intervals. Do not take your medicine more often than directed. Do not stop taking this medicine except on the advice of your doctor or health care professional. Talk to your pediatrician regarding the use of this medicine in children. Special care may be needed. While this drug may be prescribed for children for selected conditions, precautions do apply. Overdosage: If you think you have taken too much of this medicine contact a poison control center or emergency room at once. NOTE: This medicine is only for you. Do not share this medicine with others. What if I miss a dose? If you miss a dose, take it as soon as you can. If it is almost time for your next dose, take only that dose. Do not take double or extra doses. What may interact with this medicine?  aminophylline  certain medicines for high blood pressure, heart disease, irregular  heartbeat like metoprolol and propranolol  digoxin  theophylline  warfarin This list may not describe all possible interactions. Give your health care provider a list of all the medicines, herbs, non-prescription drugs, or dietary supplements you use. Also tell them if you smoke, drink alcohol, or use illegal drugs. Some items may interact with your medicine. What should I watch for while using this medicine? Visit your doctor or health care professional for regular checks on your progress. Tell your doctor or healthcare professional if your symptoms do not start to get better or if they get worse. It may take time for your condition to improve. You will need tests to check your blood counts and to make sure your body is making the right amount of thyroid hormone. Call your doctor or health care professional for advice if you get a fever, chills or sore throat. Do not treat yourself. Women should inform their doctor if they wish to become pregnant or think they might be pregnant. There is a potential for serious side effects to an unborn child. Talk to your health care professional or pharmacist for more information. If you are going to need surgery or other procedure, tell your doctor that you are using this medicine. What side effects may I notice from receiving this medicine? Side effects that you should report to your doctor or health care professional as soon as possible:  allergic reactions like skin rash, itching or hives, swelling of the face, lips, or tongue)  breathing problems  fever or chills, sore throat  joint pain    numbness or tingling in the hands or feet  redness, blistering, peeling or loosening of the skin, including inside the mouth  signs and symptoms of liver injury like dark yellow or brown urine; general ill feeling or flu-like symptoms; light-colored stools; loss of appetite; nausea; right upper belly pain; unusually weak or tired; yellowing of the eyes or  skin  unusual bleeding or bruising Side effects that usually do not require medical attention (report to your doctor or health care professional if they continue or are bothersome):  change in taste  dizziness  hair loss  headache  nausea, vomiting This list may not describe all possible side effects. Call your doctor for medical advice about side effects. You may report side effects to FDA at 1-800-FDA-1088. Where should I keep my medicine? Keep out of the reach of children. Store at room temperature between 20 and 25 degrees C (68 and 77 degrees F). Keep tightly closed. Protect from light. Throw away any unused medicine after the expiration date. NOTE: This sheet is a summary. It may not cover all possible information. If you have questions about this medicine, talk to your doctor, pharmacist, or health care provider.  2021 Elsevier/Gold Standard (2016-10-28 17:55:57)  

## 2020-06-01 NOTE — Progress Notes (Signed)
06/01/2020     Endocrinology Follow Up Visit   Subjective:    Patient ID: Zoe Webb, female    DOB: [birth year], PCP Kirstie Peri, MD.   Past Medical History:  Diagnosis Date  . Hypertension   . Osteopenia   . Vitamin D deficiency     Past Surgical History:  Procedure Laterality Date  . CYST REMOVAL NECK  74-75  . TUBAL LIGATION  1979    Social History   Socioeconomic History  . Marital status: Widowed    Spouse name: Not on file  . Number of children: Not on file  . Years of education: Not on file  . Highest education level: Not on file  Occupational History  . Not on file  Tobacco Use  . Smoking status: Never Smoker  . Smokeless tobacco: Never Used  Substance and Sexual Activity  . Alcohol use: Not Currently  . Drug use: Never  . Sexual activity: Not Currently  Other Topics Concern  . Not on file  Social History Narrative  . Not on file   Social Determinants of Health   Financial Resource Strain: Not on file  Food Insecurity: Not on file  Transportation Needs: Not on file  Physical Activity: Not on file  Stress: Not on file  Social Connections: Not on file    Family History  Problem Relation Age of Onset  . Cancer Father   . Thyroid disease Sister     Outpatient Encounter Medications as of 06/01/2020  Medication Sig  . alendronate (FOSAMAX) 70 MG tablet Take 70 mg by mouth once a week.  . calcium carbonate (TUMS - DOSED IN MG ELEMENTAL CALCIUM) 500 MG chewable tablet Chew 1 tablet by mouth daily.  Marland Kitchen lisinopril-hydrochlorothiazide (ZESTORETIC) 20-12.5 MG tablet Take 1 tablet by mouth daily.  . methimazole (TAPAZOLE) 5 MG tablet TAKE 1 TABLET (5 MG TOTAL) BY MOUTH DAILY.  Marland Kitchen Vitamin D, Ergocalciferol, (DRISDOL) 1.25 MG (50000 UNIT) CAPS capsule Take 50,000 Units by mouth once a week.   No facility-administered encounter medications on file as of 06/01/2020.    ALLERGIES: No Known Allergies  VACCINATION STATUS:  There is no  immunization history on file for this patient.   HPI  Zoe Webb is 70 y.o. female who presents today with a medical history as above. she is being seen in follow up after being seen in consultation for hyperthyroidism as requested by Kirstie Peri, MD.  she has been dealing with symptoms of fatigue, heat intolerance, weight gain, and alternating constipation/diarrhea for approximately 1 year.  Her recent thyroid function tests reviewed. she denies dysphagia, choking, shortness of breath, no recent voice change. She has no new complaints at this time and reports some improvements in some of her symptoms without intervention.   she does have a family history of thyroid dysfunction in her sister (hypothyroidism), but denies family hx of thyroid cancer. she denies personal history of goiter. she is not on any anti-thyroid medications nor on any thyroid hormone supplements at this time.  She had previously taken levothyroxine, prescribed by her PCP, but was taken off due to fluctuations in her thyroid labs.  Review of systems  Constitutional: + steadily decreasing body weight (had stomach bug recently),  current Body mass index is 32.55 kg/m. , no fatigue, no subjective hyperthermia, no subjective hypothermia Eyes: no blurry vision, no xerophthalmia ENT: no sore throat, no nodules palpated in throat, no dysphagia/odynophagia, no hoarseness Cardiovascular: no chest pain, no  shortness of breath, no palpitations, no leg swelling Respiratory: no cough, no shortness of breath Gastrointestinal: no nausea/vomiting/diarrhea Musculoskeletal: no muscle/joint aches Skin: no rashes, no hyperemia Neurological: no tremors, no numbness, no tingling, no dizziness Psychiatric: no depression, no anxiety   Objective:    BP 118/76 (BP Location: Left Arm, Patient Position: Sitting)   Pulse 66   Ht 5\' 5"  (1.651 m)   Wt 195 lb 9.6 oz (88.7 kg)   BMI 32.55 kg/m   Wt Readings from Last 3 Encounters:   06/01/20 195 lb 9.6 oz (88.7 kg)  03/03/20 202 lb 3.2 oz (91.7 kg)  10/16/19 203 lb (92.1 kg)    BP Readings from Last 3 Encounters:  06/01/20 118/76  03/03/20 138/83  10/16/19 134/87      Physical Exam- Limited  Constitutional:  Body mass index is 32.55 kg/m. , not in acute distress, normal state of mind Eyes:  EOMI, no exophthalmos Neck: Supple Cardiovascular: RRR, no murmers, rubs, or gallops, no edema Respiratory: Adequate breathing efforts, no crackles, rales, rhonchi, or wheezing Musculoskeletal: no gross deformities, strength intact in all four extremities, no gross restriction of joint movements Skin:  no rashes, no hyperemia Neurological: no tremor with outstretched hands   CMP  No results found for: NA, K, CL, CO2, GLUCOSE, BUN, CREATININE, CALCIUM, PROT, ALBUMIN, AST, ALT, ALKPHOS, BILITOT, GFRNONAA, GFRAA   CBC No results found for: WBC, RBC, HGB, HCT, PLT, MCV, MCH, MCHC, RDW, LYMPHSABS, MONOABS, EOSABS, BASOSABS   Diabetic Labs (most recent): No results found for: HGBA1C  Lipid Panel  No results found for: CHOL, TRIG, HDL, CHOLHDL, VLDL, LDLCALC, LDLDIRECT, LABVLDL   Lab Results  Component Value Date   TSH 0.821 05/27/2020   TSH 0.035 (L) 02/26/2020   TSH 0.062 (L) 10/03/2019   FREET4 1.39 05/27/2020   FREET4 1.52 02/26/2020   FREET4 1.35 10/03/2019      Thyroid ultrasound performed on 08/12/2019 at [facility]:  Right lobe measuring 4.6 x 0.9 x 2.0 cm Left lobe measuring 6.6 x 2.9 x 2.8 cm Estimated total number of nodules greater than or equal to 1 cm: 3  Nodule 1: Location is in the left mid lobe measuring 3.4 x 2.0 x 2.4 cm Composition is solid/almost completely solid Echogenicity is isoechoic Ill-defined margins ACR TI-RADS risk category 3 Given size and appearance, fine-needle aspiration of this mildly suspicious nodule should be considered based on TI-RADS criteria.  Nodule 2: Location is left superior lobe measuring 1.8 x 1 x 1.4  cm Composition is solid/almost completely solid Echogenicity is hypoechoic Ill-defined margins ACR TI-RADS risk category 4-6 Given size and appearance fine-needle aspiration of this moderately suspicious nodule should be considered based on TI-RADS criteria  Nodule 3: Location is left inferior lobe measuring 1.3 x 1 x 0.8 cm Composition is solid/almost completely solid Echogenicity is hypoechoic Ill-defined margins ACR TI-RADS risk category 4-6 Given size and appearance, a follow-up ultrasound in 1 year should be considered based on TI-RADS criteria --------------------------------------------------------------------------------------------------------------  Pathology Report: 10/10/19 NODULE 1: Clinical History: 3.4 cm LML  Specimen Submitted: A. THYROID, LEFT MID, FINE NEEDLE ASPIRATION:   FINAL MICROSCOPIC DIAGNOSIS:  - Consistent with benign follicular nodule (Bethesda category II)   SPECIMEN ADEQUACY:  Satisfactory for evaluation   GROSS:  Received is/are 3 slides in 95% Ethyl alcohol, 3 air dried slides for  diff stain, and 30 ccs of pale pink Cytolyt solution. (CM:cm)  Prepared:  Smears: 6  Concentration Method (Thin Prep): 1  Cell Block:  Cell block attempted, not obtained.  Additional Studies: Also there was an Afirma collected.  NODULE 2: Clinical History: 1.8 cm LUL  Specimen Submitted: A. THYROID, LEFT SUPERIOR, FINE NEEDLE ASPIRATION:    FINAL MICROSCOPIC DIAGNOSIS:  - Consistent with benign follicular nodule (Bethesda category II)   SPECIMEN ADEQUACY:  Satisfactory for evaluation   GROSS:  Received is/are 3 slides in 95% Ethyl alcohol, 3 air dried slides for  diff stain, and 30 ccs of clear colorless Cytolyt solution. (CM:cm)  Prepared:  Smears: 6  Concentration Method (Thin Prep): 1  Cell Block: Cell block attempted, not obtained.  Additional Studies: Also there was an Afirma  collected.  ------------------------------------------------------------------------------------------------- Uptake and Scan results from 10/29/19 EXAM: THYROID SCAN AND UPTAKE - 4 AND 24 HOURS  TECHNIQUE: Following oral administration of I-123 capsule, anterior planar imaging was acquired at 24 hours. Thyroid uptake was calculated with a thyroid probe at 4-6 hours and 24 hours.  RADIOPHARMACEUTICALS:  460.0 uCi I-123 sodium iodide p.o.  COMPARISON:  Thyroid ultrasound 08/12/2019  FINDINGS: Minimal thyroid heterogeneity without focal hot or cold nodules.  4 hour I-123 uptake = 14.1% (normal 5-20%)  24 hour I-123 uptake = 26.0% (normal 10-30%)  IMPRESSION: 1. Normal radioactive iodine uptake at 4 and 24 hours. 2. Minimally heterogeneous appearance of the gland without focal hot or cold nodules. 3. Based on prior ultrasound appearance, findings could reflect subacute thyroiditis or mild/early toxic multinodular goiter.     Results for SACORA, HAWBAKER" (MRN [id number]) as of 06/01/2020 09:35  Ref. Range 02/26/2020 09:50 05/27/2020 08:34  TSH Latest Ref Range: 0.450 - 4.500 uIU/mL 0.035 (L) 0.821  Triiodothyronine,Free,Serum Latest Ref Range: 2.0 - 4.4 pg/mL 4.1 3.5  T4,Free(Direct) Latest Ref Range: 0.82 - 1.77 ng/dL 6.44 0.34     Assessment & Plan:   1. Multinodular goiter The biopsy of the two suspicious nodules show benign follicular nodules.  Will plan to repeat thyroid US in 1 year.  2. Subclinical Hyperthyroidism -Her previsit thyroid function tests show improving TFTs with Methimazole treatment at last visit.  She is advised to continue Methimazole 5 mg po daily for now. Will recheck thyroid function tests prior to next visit in 3 months.  -Her uptake and scan was high normal, indicating that she is has mild hyperthyroidism or is recovering from thyroiditis.    -Patient is advised to maintain close follow up with Kirstie Peri, MD for primary care  needs.    I spent 30 minutes in the care of the patient today including review of labs from Thyroid Function, CMP, and other relevant labs ; imaging/biopsy records (current and previous including abstractions from other facilities); face-to-face time discussing  her lab results and symptoms, medications doses, her options of short and long term treatment based on the latest standards of care / guidelines;   and documenting the encounter.  Zoe Webb  participated in the discussions, expressed understanding, and voiced agreement with the above plans.  All questions were answered to her satisfaction. she is encouraged to contact clinic should she have any questions or concerns prior to her return visit.    Follow up plan: Return in about 3 months (around 08/31/2020) for Thyroid follow up, Previsit labs.   Ronny Bacon, [facility] [address] [place], [place] [address] Phone: [phone] Fax: [phone]   06/01/2020, 10:09 AM

## 2020-08-17 ENCOUNTER — Encounter: Payer: Self-pay | Admitting: Nurse Practitioner

## 2020-08-18 DIAGNOSIS — E0590 Thyrotoxicosis, unspecified without thyrotoxic crisis or storm: Secondary | ICD-10-CM | POA: Diagnosis not present

## 2020-08-19 LAB — T4, FREE: Free T4: 1.03 ng/dL (ref 0.82–1.77)

## 2020-08-19 LAB — TSH: TSH: 1.39 u[IU]/mL (ref 0.450–4.500)

## 2020-08-19 LAB — T3, FREE: T3, Free: 2.6 pg/mL (ref 2.0–4.4)

## 2020-08-26 ENCOUNTER — Other Ambulatory Visit: Payer: Self-pay | Admitting: Nurse Practitioner

## 2020-08-31 ENCOUNTER — Encounter: Payer: Self-pay | Admitting: Nurse Practitioner

## 2020-08-31 ENCOUNTER — Telehealth (INDEPENDENT_AMBULATORY_CARE_PROVIDER_SITE_OTHER): Payer: Medicare Other | Admitting: Nurse Practitioner

## 2020-08-31 ENCOUNTER — Other Ambulatory Visit: Payer: Self-pay

## 2020-08-31 VITALS — Ht 65.0 in | Wt 194.0 lb

## 2020-08-31 DIAGNOSIS — E0590 Thyrotoxicosis, unspecified without thyrotoxic crisis or storm: Secondary | ICD-10-CM | POA: Diagnosis not present

## 2020-08-31 DIAGNOSIS — E042 Nontoxic multinodular goiter: Secondary | ICD-10-CM | POA: Diagnosis not present

## 2020-08-31 NOTE — Patient Instructions (Signed)
Thyroid Nodule  A thyroid nodule is an isolated growth of thyroid cells that forms a lump in your thyroid gland. The thyroid gland is a butterfly-shaped gland. It is found in the lower front of your neck. This gland sends chemical messengers (hormones) through your blood to all parts of your body. These hormones are important inregulating your body temperature and helping your body to use energy. Thyroid nodules are common. Most are not cancerous (benign). You may have one nodule or several nodules. Different types of thyroid nodules include nodules that: Grow and fill with fluid (thyroid cysts). Produce too much thyroid hormone (hot nodules or hyperthyroid). Produce no thyroid hormone (cold nodules or hypothyroid). Form from cancer cells (thyroid cancers). What are the causes? In most cases, the cause of this condition is not known. What increases the risk? The following factors may make you more likely to develop this condition. Age. Thyroid nodules become more common in people who are older than 70 years of age. Gender. Benign thyroid nodules are more common in women. Cancerous (malignant) thyroid nodules are more common in men. A family history that includes: Thyroid nodules. Pheochromocytoma. Thyroid carcinoma. Hyperparathyroidism. Certain kinds of thyroid diseases, such as Hashimoto's thyroiditis. Lack of iodine in your diet. A history of head and neck radiation, such as from previous cancer treatment. What are the signs or symptoms? In many cases, there are no symptoms. If you have symptoms, they may include: A lump in your lower neck. Feeling a lump or tickle in your throat. Pain in your neck, jaw, or ear. Having trouble swallowing. Hot nodules may cause symptoms that include: Weight loss. Warm, flushed skin. Feeling hot. Feeling nervous. A racing heartbeat. Cold nodules may cause symptoms that include: Weight gain. Dry skin. Brittle hair. This may also occur with hair  loss. Feeling cold. Fatigue. Thyroid cancer nodules may cause symptoms that include: Hard nodules that feel stuck to the thyroid gland. Hoarseness. Lumps in the glands near your thyroid (lymph nodes). How is this diagnosed? A thyroid nodule may be felt by your health care provider during a physical exam. This condition may also be diagnosed based on your symptoms. You may also have tests, including: An ultrasound. This may be done to confirm the diagnosis. A biopsy. This involves taking a sample from the nodule and looking at it under a microscope. Blood tests to make sure that your thyroid is working properly. A thyroid scan. This test uses a radioactive tracer injected into a vein to create an image of the thyroid gland on a computer screen. Imaging tests such as MRI or CT scan. These may be done if: Your nodule is large. Your nodule is blocking your airway. Cancer is suspected. How is this treated? Treatment depends on the cause and size of your nodule or nodules. If the nodule is benign, treatment may not be necessary. Your health care provider may monitor the nodule to see if it goes away without treatment. If the nodule continues to grow, is cancerous, or does not go away, treatment may be needed. Treatment may include: Having a cystic nodule drained with a needle. Ablation therapy. In this treatment, alcohol is injected into the area of the nodule to destroy the cells. Ablation with heat (thermal ablation) may also be used. Radioactive iodine. In this treatment, radioactive iodine is given as a pill or liquid that you drink. This substance causes the thyroid nodule to shrink. Surgery to remove the nodule. Part or all of your thyroid gland may   need to be removed as well. Medicines. Follow these instructions at home: Pay attention to any changes in your nodule. Take over-the-counter and prescription medicines only as told by your health care provider. Keep all follow-up visits as told  by your health care provider. This is important. Contact a health care provider if: Your voice changes. You have trouble swallowing. You have pain in your neck, ear, or jaw that is getting worse. Your nodule gets bigger. Your nodule starts to make it harder for you to breathe. Your muscles look like they are shrinking (muscle wasting). Get help right away if: You have chest pain. There is a loss of consciousness. You have a sudden fever. You feel confused. You are seeing or hearing things that other people do not see or hear (having hallucinations). You feel very weak. You have mood swings. You feel very restless. You feel suddenly nauseous or throw up. You suddenly have diarrhea. Summary A thyroid nodule is an isolated growth of thyroid cells that forms a lump in your thyroid gland. Thyroid nodules are common. Most are not cancerous (benign). You may have one nodule or several nodules. Treatment depends on the cause and size of your nodule or nodules. If the nodule is benign, treatment may not be necessary. Your health care provider may monitor the nodule to see if it goes away without treatment. If the nodule continues to grow, is cancerous, or does not go away, treatment may be needed. This information is not intended to replace advice given to you by your health care provider. Make sure you discuss any questions you have with your healthcare provider. Document Revised: 09/08/2017 Document Reviewed: 09/11/2017 Elsevier Patient Education  2022 [employer]  

## 2020-08-31 NOTE — Progress Notes (Signed)
08/31/2020     Endocrinology Follow Up Visit   TELEHEALTH VISIT: The patient is being engaged in telehealth visit.  This type of visit limits physical examination significantly, and thus is not preferable over face-to-face encounters.  I connected with  Zoe Webb on 08/31/20 by a video enabled telemedicine application and verified that I am speaking with the correct person using two identifiers.   I discussed the limitations of evaluation and management by telemedicine. The patient expressed understanding and agreed to proceed.    The participants involved in this visit include: Dani GobbleWhitney J Sheran Newstrom, NP [facility] and Zoe Webb  located at their personal residence listed.  Subjective:    Patient ID: Zoe Webb, Webb    DOB: 11-15-1950, PCP Zoe Webb.   Past Medical History:  Diagnosis Date   Hypertension    Osteopenia    Vitamin D deficiency     Past Surgical History:  Procedure Laterality Date   CYST REMOVAL NECK  74-75   TUBAL LIGATION  1979    Social History   Socioeconomic History   Marital status: Widowed    Spouse name: Not on file   Number of children: Not on file   Years of education: Not on file   Highest education level: Not on file  Occupational History   Not on file  Tobacco Use   Smoking status: Never   Smokeless tobacco: Never  Substance and Sexual Activity   Alcohol use: Not Currently   Drug use: Never   Sexual activity: Not Currently  Other Topics Concern   Not on file  Social History Narrative   Not on file   Social Determinants of Health   Financial Resource Strain: Not on file  Food Insecurity: Not on file  Transportation Needs: Not on file  Physical Activity: Not on file  Stress: Not on file  Social Connections: Not on file    Family History  Problem Relation Age of Onset   Cancer Father    Thyroid disease Sister     Outpatient Encounter Medications as of  08/31/2020  Medication Sig   alendronate (FOSAMAX) 70 MG tablet Take 70 mg by mouth once a week.   calcium carbonate (TUMS - DOSED IN MG ELEMENTAL CALCIUM) 500 MG chewable tablet Chew 1 tablet by mouth daily. (Patient not taking: Reported on 08/31/2020)   lisinopril-hydrochlorothiazide (ZESTORETIC) 20-12.5 MG tablet Take 1 tablet by mouth daily.   Vitamin D, Ergocalciferol, (DRISDOL) 1.25 MG (50000 UNIT) CAPS capsule Take 50,000 Units by mouth once a week.   [DISCONTINUED] methimazole (TAPAZOLE) 5 MG tablet TAKE 1 TABLET (5 MG TOTAL) BY MOUTH DAILY.   No facility-administered encounter medications on file as of 08/31/2020.    ALLERGIES: No Known Allergies  VACCINATION STATUS:  There is no immunization history on file for this patient.   HPI  Zoe Webb is 70 y.o. Webb who presents today with a medical history as above. she is being seen in follow up after being seen in consultation for hyperthyroidism as requested by Zoe Webb.  she has been dealing with symptoms of fatigue, heat intolerance, weight gain, and alternating constipation/diarrhea for approximately 1 year.   Her recent thyroid function tests reviewed.  she denies dysphagia, choking, shortness of breath, no recent voice change. She has no new complaints at this time and reports some improvements in some of her symptoms without intervention.   she does have a family  history of thyroid dysfunction in her sister (hypothyroidism), but denies family hx of thyroid cancer. she denies personal history of goiter. she is not on any anti-thyroid medications nor on any thyroid hormone supplements at this time.  She had previously taken levothyroxine, prescribed by her PCP, but was taken off due to fluctuations in her thyroid labs.  Review of systems  Constitutional: + steadily decreasing body weight (had stomach bug recently),  current Body mass index is 32.28 kg/m. , no fatigue, no subjective hyperthermia, no subjective  hypothermia Eyes: no blurry vision, no xerophthalmia ENT: no sore throat, no nodules palpated in throat, no dysphagia/odynophagia, no hoarseness Cardiovascular: no chest pain, no shortness of breath, no palpitations, no leg swelling Respiratory: no cough, no shortness of breath Gastrointestinal: no nausea/vomiting/diarrhea Musculoskeletal: no muscle/joint aches Skin: no rashes, no hyperemia Neurological: no tremors, no numbness, no tingling, no dizziness Psychiatric: no depression, no anxiety   Objective:    Ht 5\' 5"  (1.651 m)   Wt 194 lb (88 kg)   BMI 32.28 kg/m   Wt Readings from Last 3 Encounters:  08/31/20 194 lb (88 kg)  06/01/20 195 lb 9.6 oz (88.7 kg)  03/03/20 202 lb 3.2 oz (91.7 kg)    BP Readings from Last 3 Encounters:  06/01/20 118/76  03/03/20 138/83  10/16/19 134/87    Physical Exam- Telehealth- significantly limited due to nature of visit  Constitutional: Body mass index is 32.28 kg/m. , not in acute distress, normal state of mind Respiratory: Adequate breathing efforts   CMP  No results found for: NA, K, CL, CO2, GLUCOSE, BUN, CREATININE, CALCIUM, PROT, ALBUMIN, AST, ALT, ALKPHOS, BILITOT, GFRNONAA, GFRAA   CBC No results found for: WBC, RBC, HGB, HCT, PLT, MCV, MCH, MCHC, RDW, LYMPHSABS, MONOABS, EOSABS, BASOSABS   Diabetic Labs (most recent): No results found for: HGBA1C  Lipid Panel  No results found for: CHOL, TRIG, HDL, CHOLHDL, VLDL, LDLCALC, LDLDIRECT, LABVLDL   Lab Results  Component Value Date   TSH 1.390 08/18/2020   TSH 0.821 05/27/2020   TSH 0.035 (L) 02/26/2020   TSH 0.062 (L) 10/03/2019   FREET4 1.03 08/18/2020   FREET4 1.39 05/27/2020   FREET4 1.52 02/26/2020   FREET4 1.35 10/03/2019      Thyroid ultrasound performed on 08/12/2019 at [facility]:  Right lobe measuring 4.6 x 0.9 x 2.0 cm Left lobe measuring 6.6 x 2.9 x 2.8 cm Estimated total number of nodules greater than or equal to 1 cm: 3  Nodule 1: Location is in  the left mid lobe measuring 3.4 x 2.0 x 2.4 cm Composition is solid/almost completely solid Echogenicity is isoechoic Ill-defined margins ACR TI-RADS risk category 3 Given size and appearance, fine-needle aspiration of this mildly suspicious nodule should be considered based on TI-RADS criteria.  Nodule 2: Location is left superior lobe measuring 1.8 x 1 x 1.4 cm Composition is solid/almost completely solid Echogenicity is hypoechoic Ill-defined margins ACR TI-RADS risk category 4-6 Given size and appearance fine-needle aspiration of this moderately suspicious nodule should be considered based on TI-RADS criteria  Nodule 3: Location is left inferior lobe measuring 1.3 x 1 x 0.8 cm Composition is solid/almost completely solid Echogenicity is hypoechoic Ill-defined margins ACR TI-RADS risk category 4-6 Given size and appearance, a follow-up ultrasound in 1 year should be considered based on TI-RADS criteria --------------------------------------------------------------------------------------------------------------  Pathology Report: 10/10/19 NODULE 1: Clinical History: 3.4 cm LML  Specimen Submitted:  A. THYROID, LEFT MID, FINE NEEDLE ASPIRATION:   FINAL MICROSCOPIC  DIAGNOSIS:  - Consistent with benign follicular nodule (Bethesda category II)   SPECIMEN ADEQUACY:  Satisfactory for evaluation   GROSS:  Received is/are 3 slides in 95% Ethyl alcohol, 3 air dried slides for  diff stain, and 30 ccs of pale pink Cytolyt solution. (CM:cm)  Prepared:  Smears:  6  Concentration Method (Thin Prep):  1  Cell Block:  Cell block attempted, not obtained.  Additional Studies:  Also there was an Afirma collected.  NODULE 2: Clinical History: 1.8 cm LUL  Specimen Submitted:  A. THYROID, LEFT SUPERIOR, FINE NEEDLE ASPIRATION:    FINAL MICROSCOPIC DIAGNOSIS:  - Consistent with benign follicular nodule (Bethesda category II)   SPECIMEN ADEQUACY:  Satisfactory for evaluation   GROSS:   Received is/are 3 slides in 95% Ethyl alcohol, 3 air dried slides for  diff stain, and 30 ccs of clear colorless Cytolyt solution. (CM:cm)  Prepared:  Smears:  6  Concentration Method (Thin Prep):  1  Cell Block:  Cell block attempted, not obtained.  Additional Studies:  Also there was an Afirma collected.  ------------------------------------------------------------------------------------------------- Uptake and Scan results from 10/29/19 EXAM: THYROID SCAN AND UPTAKE - 4 AND 24 HOURS   TECHNIQUE: Following oral administration of I-123 capsule, anterior planar imaging was acquired at 24 hours. Thyroid uptake was calculated with a thyroid probe at 4-6 hours and 24 hours.   RADIOPHARMACEUTICALS:  460.0 uCi I-123 sodium iodide p.o.   COMPARISON:  Thyroid ultrasound 08/12/2019   FINDINGS: Minimal thyroid heterogeneity without focal hot or cold nodules.   4 hour I-123 uptake = 14.1% (normal 5-20%)   24 hour I-123 uptake = 26.0% (normal 10-30%)   IMPRESSION: 1. Normal radioactive iodine uptake at 4 and 24 hours. 2. Minimally heterogeneous appearance of the gland without focal hot or cold nodules. 3. Based on prior ultrasound appearance, findings could reflect subacute thyroiditis or mild/early toxic multinodular goiter.     Results for TONNIA, BARDIN" (MRN [id number]) as of 06/01/2020 09:35  Ref. Range 02/26/2020 09:50 05/27/2020 08:34  TSH Latest Ref Range: 0.450 - 4.500 uIU/mL 0.035 (L) 0.821  Triiodothyronine,Free,Serum Latest Ref Range: 2.0 - 4.4 pg/mL 4.1 3.5  T4,Free(Direct) Latest Ref Range: 0.82 - 1.77 ng/dL 3.26 7.12     Assessment & Plan:   1. Multinodular goiter The biopsy of the two suspicious nodules show benign follicular nodules.  Will repeat thyroid US prior to next visit for surveillance.  2. Subclinical Hyperthyroidism -Her previsit thyroid function tests show continued improvement with Methimazole treatment.  Will trial her off the Methimazole  for now and see how she does as this medication is not meant to be taken long-term.  Will recheck TFTs in 3 months or sooner if she starts to develop symptoms of overactive thyroid again.  -Her uptake and scan was high normal, indicating that she is has mild hyperthyroidism or is recovering from thyroiditis.    -Patient is advised to maintain close follow up with Zoe Peri, Webb for primary care needs.      I spent 20 minutes dedicated to the care of this patient on the date of this encounter to include pre-visit review of records, face-to-face time with the patient, and post visit ordering of  testing.  Zoe Dodge  participated in the discussions, expressed understanding, and voiced agreement with the above plans.  All questions were answered to her satisfaction. she is encouraged to contact clinic should she have any questions or concerns prior to her return visit.  Follow up plan: Return in about 3 months (around 12/01/2020) for Thyroid follow up, thyroid ultrasound, Previsit labs.   Ronny Bacon, [facility] [address] [place], [place] [address] Phone: [phone] Fax: [phone]   08/31/2020, 8:46 AM

## 2020-10-21 ENCOUNTER — Other Ambulatory Visit: Payer: Self-pay

## 2020-10-21 ENCOUNTER — Ambulatory Visit (HOSPITAL_COMMUNITY)
Admission: RE | Admit: 2020-10-21 | Discharge: 2020-10-21 | Disposition: A | Discharge status: Home / Self Care | Payer: Medicare Other | Source: Ambulatory Visit | Attending: Nurse Practitioner | Admitting: Nurse Practitioner

## 2020-10-21 DIAGNOSIS — E042 Nontoxic multinodular goiter: Secondary | ICD-10-CM | POA: Insufficient documentation

## 2020-10-21 DIAGNOSIS — E0590 Thyrotoxicosis, unspecified without thyrotoxic crisis or storm: Secondary | ICD-10-CM | POA: Diagnosis not present

## 2020-11-18 IMAGING — NM NM THYROID IMAGING W/ UPTAKE MULTI (4&24 HR)
4 series · 4 of 4 positions shown · non-contrast
Comparison: Thyroid ultrasound 08/12/2019

CLINICAL DATA: Hyperthyroidism.  Serum TSH 0.062.

EXAM:
THYROID SCAN AND UPTAKE - 4 AND 24 HOURS
TECHNIQUE: Following oral administration of 9-IZG capsule, anterior planar
imaging was acquired at 24 hours. Thyroid uptake was calculated with
a thyroid probe at 4-6 hours and 24 hours.
RADIOPHARMACEUTICALS:  460.0 uCi 9-IZG sodium iodide p.o.

[Series 1: anterior · 1.18mm/px · 1 of 1 slices shown]
[im 1/1]
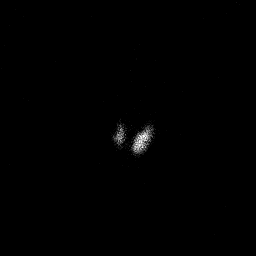

[Series 2: ant w marker · 1.18mm/px · 1 of 1 slices shown]
[im 1/1]
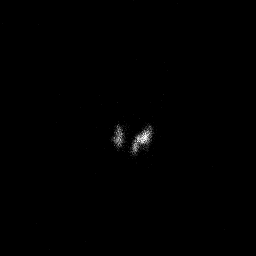

[Series 3: lao · 1.18mm/px · 1 of 1 slices shown]
[im 1/1]
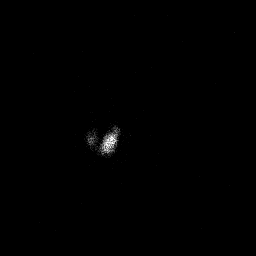

[Series 4: rao · 1.18mm/px · 1 of 1 slices shown]
[im 1/1]
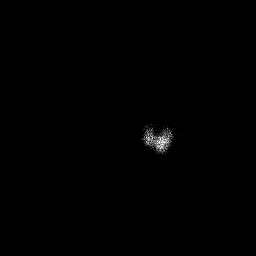

[4 of 4 positions shown; findings below may reference images not displayed]

FINDINGS: Minimal thyroid heterogeneity without focal hot or cold nodules.

4 hour 9-IZG uptake = 14.1% (normal 5-20%)

24 hour 9-IZG uptake = 26.0% (normal 10-30%)
IMPRESSION: 1. Normal radioactive iodine uptake at 4 and 24 hours.
2. Minimally heterogeneous appearance of the gland without focal hot
or cold nodules.
3. Based on prior ultrasound appearance, findings could reflect
subacute thyroiditis or mild/early toxic multinodular goiter.

## 2020-11-28 ENCOUNTER — Encounter: Payer: Self-pay | Admitting: Nurse Practitioner

## 2020-11-30 DIAGNOSIS — E0590 Thyrotoxicosis, unspecified without thyrotoxic crisis or storm: Secondary | ICD-10-CM | POA: Diagnosis not present

## 2020-11-30 DIAGNOSIS — E042 Nontoxic multinodular goiter: Secondary | ICD-10-CM | POA: Diagnosis not present

## 2020-12-01 LAB — TSH: TSH: 0.058 u[IU]/mL — ABNORMAL LOW (ref 0.450–4.500)

## 2020-12-01 LAB — T4, FREE: Free T4: 1.51 ng/dL (ref 0.82–1.77)

## 2020-12-01 LAB — T3, FREE: T3, Free: 3.9 pg/mL (ref 2.0–4.4)

## 2020-12-03 ENCOUNTER — Encounter: Payer: Self-pay | Admitting: Nurse Practitioner

## 2020-12-03 ENCOUNTER — Other Ambulatory Visit: Payer: Self-pay

## 2020-12-03 ENCOUNTER — Ambulatory Visit: Payer: Medicare Other | Admitting: Nurse Practitioner

## 2020-12-03 VITALS — BP 122/72 | HR 70 | Ht 65.0 in | Wt 192.2 lb

## 2020-12-03 DIAGNOSIS — E0590 Thyrotoxicosis, unspecified without thyrotoxic crisis or storm: Secondary | ICD-10-CM

## 2020-12-03 NOTE — Progress Notes (Signed)
12/03/2020     Endocrinology Follow Up Visit    Subjective:    Patient ID: Zoe Webb, female    DOB: 1950/08/19, PCP Kirstie Peri, MD.   Past Medical History:  Diagnosis Date   Hypertension    Osteopenia    Vitamin D deficiency     Past Surgical History:  Procedure Laterality Date   CYST REMOVAL NECK  74-75   TUBAL LIGATION  1979    Social History   Socioeconomic History   Marital status: Widowed    Spouse name: Not on file   Number of children: Not on file   Years of education: Not on file   Highest education level: Not on file  Occupational History   Not on file  Tobacco Use   Smoking status: Never   Smokeless tobacco: Never  Vaping Use   Vaping Use: Never used  Substance and Sexual Activity   Alcohol use: Not Currently   Drug use: Never   Sexual activity: Not Currently  Other Topics Concern   Not on file  Social History Narrative   Not on file   Social Determinants of Health   Financial Resource Strain: Not on file  Food Insecurity: Not on file  Transportation Needs: Not on file  Physical Activity: Not on file  Stress: Not on file  Social Connections: Not on file    Family History  Problem Relation Age of Onset   Cancer Father    Thyroid disease Sister     Outpatient Encounter Medications as of 12/03/2020  Medication Sig   alendronate (FOSAMAX) 70 MG tablet Take 70 mg by mouth once a week.   calcium carbonate (TUMS - DOSED IN MG ELEMENTAL CALCIUM) 500 MG chewable tablet Chew 1 tablet by mouth as needed.   lisinopril-hydrochlorothiazide (ZESTORETIC) 20-12.5 MG tablet Take 1 tablet by mouth daily.   Vitamin D, Ergocalciferol, (DRISDOL) 1.25 MG (50000 UNIT) CAPS capsule Take 50,000 Units by mouth once a week.   No facility-administered encounter medications on file as of 12/03/2020.    ALLERGIES: No Known Allergies  VACCINATION STATUS:  There is no immunization history on file for this patient.   HPI  Zoe Webb  is 70 y.o. female who presents today with a medical history as above. she is being seen in follow up after being seen in consultation for hyperthyroidism as requested by Kirstie Peri, MD.  she has been dealing with symptoms of fatigue, heat intolerance, weight gain, and alternating constipation/diarrhea for approximately 1 year.   Her recent thyroid function tests reviewed.  she denies dysphagia, choking, shortness of breath, no recent voice change. She has no new complaints at this time and reports some improvements in some of her symptoms without intervention.   she does have a family history of thyroid dysfunction in her sister (hypothyroidism), but denies family hx of thyroid cancer. she denies personal history of goiter. she is not on any anti-thyroid medications nor on any thyroid hormone supplements at this time.  She had previously taken levothyroxine, prescribed by her PCP, but was taken off due to fluctuations in her thyroid labs.  Review of systems  Constitutional: + steadily decreasing body weight,  current Body mass index is 31.98 kg/m. , no fatigue, no subjective hyperthermia, no subjective hypothermia Eyes: no blurry vision, no xerophthalmia ENT: no sore throat, no nodules palpated in throat, no dysphagia/odynophagia, no hoarseness Cardiovascular: no chest pain, no shortness of breath, + intermittent palpitations, no leg swelling  Respiratory: no cough, no shortness of breath Gastrointestinal: no nausea/vomiting/diarrhea Musculoskeletal: no muscle/joint aches Skin: no rashes, no hyperemia Neurological: no tremors, no numbness, no tingling, no dizziness Psychiatric: no depression, no anxiety, reports increased stress   Objective:    BP 122/72   Pulse 70   Ht 5\' 5"  (1.651 m)   Wt 192 lb 3.2 oz (87.2 kg)   BMI 31.98 kg/m   Wt Readings from Last 3 Encounters:  12/03/20 192 lb 3.2 oz (87.2 kg)  08/31/20 194 lb (88 kg)  06/01/20 195 lb 9.6 oz (88.7 kg)    BP Readings from  Last 3 Encounters:  12/03/20 122/72  06/01/20 118/76  03/03/20 138/83     Physical Exam- Limited  Constitutional:  Body mass index is 31.98 kg/m. , not in acute distress, normal state of mind Eyes:  EOMI, no exophthalmos Neck: Supple Cardiovascular: RRR, no murmurs, rubs, or gallops, no edema Respiratory: Adequate breathing efforts, no crackles, rales, rhonchi, or wheezing Musculoskeletal: no gross deformities, strength intact in all four extremities, no gross restriction of joint movements Skin:  no rashes, no hyperemia Neurological: no tremor with outstretched hands   CMP  No results found for: NA, K, CL, CO2, GLUCOSE, BUN, CREATININE, CALCIUM, PROT, ALBUMIN, AST, ALT, ALKPHOS, BILITOT, GFRNONAA, GFRAA   CBC No results found for: WBC, RBC, HGB, HCT, PLT, MCV, MCH, MCHC, RDW, LYMPHSABS, MONOABS, EOSABS, BASOSABS   Diabetic Labs (most recent): No results found for: HGBA1C  Lipid Panel  No results found for: CHOL, TRIG, HDL, CHOLHDL, VLDL, LDLCALC, LDLDIRECT, LABVLDL   Lab Results  Component Value Date   TSH 0.058 (L) 11/30/2020   TSH 1.390 08/18/2020   TSH 0.821 05/27/2020   TSH 0.035 (L) 02/26/2020   TSH 0.062 (L) 10/03/2019   FREET4 1.51 11/30/2020   FREET4 1.03 08/18/2020   FREET4 1.39 05/27/2020   FREET4 1.52 02/26/2020   FREET4 1.35 10/03/2019      Thyroid ultrasound performed on 08/12/2019 at [facility]:  Right lobe measuring 4.6 x 0.9 x 2.0 cm Left lobe measuring 6.6 x 2.9 x 2.8 cm Estimated total number of nodules greater than or equal to 1 cm: 3  Nodule 1: Location is in the left mid lobe measuring 3.4 x 2.0 x 2.4 cm Composition is solid/almost completely solid Echogenicity is isoechoic Ill-defined margins ACR TI-RADS risk category 3 Given size and appearance, fine-needle aspiration of this mildly suspicious nodule should be considered based on TI-RADS criteria.  Nodule 2: Location is left superior lobe measuring 1.8 x 1 x 1.4 cm Composition is  solid/almost completely solid Echogenicity is hypoechoic Ill-defined margins ACR TI-RADS risk category 4-6 Given size and appearance fine-needle aspiration of this moderately suspicious nodule should be considered based on TI-RADS criteria  Nodule 3: Location is left inferior lobe measuring 1.3 x 1 x 0.8 cm Composition is solid/almost completely solid Echogenicity is hypoechoic Ill-defined margins ACR TI-RADS risk category 4-6 Given size and appearance, a follow-up ultrasound in 1 year should be considered based on TI-RADS criteria --------------------------------------------------------------------------------------------------------------  Pathology Report: 10/10/19 NODULE 1: Clinical History: 3.4 cm LML  Specimen Submitted:  A. THYROID, LEFT MID, FINE NEEDLE ASPIRATION:   FINAL MICROSCOPIC DIAGNOSIS:  - Consistent with benign follicular nodule (Bethesda category II)   SPECIMEN ADEQUACY:  Satisfactory for evaluation   GROSS:  Received is/are 3 slides in 95% Ethyl alcohol, 3 air dried slides for  diff stain, and 30 ccs of pale pink Cytolyt solution. (CM:cm)  Prepared:  Smears:  6  Concentration Method (Thin Prep):  1  Cell Block:  Cell block attempted, not obtained.  Additional Studies:  Also there was an Afirma collected.  NODULE 2: Clinical History: 1.8 cm LUL  Specimen Submitted:  A. THYROID, LEFT SUPERIOR, FINE NEEDLE ASPIRATION:    FINAL MICROSCOPIC DIAGNOSIS:  - Consistent with benign follicular nodule (Bethesda category II)   SPECIMEN ADEQUACY:  Satisfactory for evaluation   GROSS:  Received is/are 3 slides in 95% Ethyl alcohol, 3 air dried slides for  diff stain, and 30 ccs of clear colorless Cytolyt solution. (CM:cm)  Prepared:  Smears:  6  Concentration Method (Thin Prep):  1  Cell Block:  Cell block attempted, not obtained.  Additional Studies:  Also there was an Afirma  collected.  ------------------------------------------------------------------------------------------------- Uptake and Scan results from 10/29/19 EXAM: THYROID SCAN AND UPTAKE - 4 AND 24 HOURS   TECHNIQUE: Following oral administration of I-123 capsule, anterior planar imaging was acquired at 24 hours. Thyroid uptake was calculated with a thyroid probe at 4-6 hours and 24 hours.   RADIOPHARMACEUTICALS:  460.0 uCi I-123 sodium iodide p.o.   COMPARISON:  Thyroid ultrasound 08/12/2019   FINDINGS: Minimal thyroid heterogeneity without focal hot or cold nodules.   4 hour I-123 uptake = 14.1% (normal 5-20%)   24 hour I-123 uptake = 26.0% (normal 10-30%)   IMPRESSION: 1. Normal radioactive iodine uptake at 4 and 24 hours. 2. Minimally heterogeneous appearance of the gland without focal hot or cold nodules. 3. Based on prior ultrasound appearance, findings could reflect subacute thyroiditis or mild/early toxic multinodular goiter.     Results for TYSON, MASIN" (MRN [id number]) as of 06/01/2020 09:35  Ref. Range 02/26/2020 09:50 05/27/2020 08:34  TSH Latest Ref Range: 0.450 - 4.500 uIU/mL 0.035 (L) 0.821  Triiodothyronine,Free,Serum Latest Ref Range: 2.0 - 4.4 pg/mL 4.1 3.5  T4,Free(Direct) Latest Ref Range: 0.82 - 1.77 ng/dL 8.41 6.60   ------------------------------------------------------------------------------------------------------------------------- Thyroid US from 10/21/20 CLINICAL DATA:  Subclinical hyperthyroidism, multinodular goiter. Previous FNA biopsy of mid left and superior left nodules 10/10/2019.   EXAM: THYROID ULTRASOUND   TECHNIQUE: Ultrasound examination of the thyroid gland and adjacent soft tissues was performed.   COMPARISON:  08/12/2019   FINDINGS: Parenchymal Echotexture: Markedly heterogenous   Isthmus: 0.2 cm thickness, previously 0.3   Right lobe: 4.7 x 2.2 x 1.9 cm, previously 4.6 x 1.7 x 2   Left lobe: 5.7 x 2.5 x 2.8 cm,  previously 6 x 6 x 2.9 x 2.8   _________________________________________________________   Estimated total number of nodules >/= 1 cm: 3   Number of spongiform nodules >/=  2 cm not described below (TR1): 0   Number of mixed cystic and solid nodules >/= 1.5 cm not described below (TR2): 0   _________________________________________________________   Nodule 1: 1.4 x 1.3 x 0.9 cm superior left, previously 1.8 x 1.4 x 1. This was previously biopsied.   Nodule 2: 3.1 x 2.3 x 2.1 cm mid left, previously 3.4 x 2.4 x 2.0; this was previously biopsied.   Nodule 3: 1.2 cm benign colloid cyst, inferior left, with no internal flow signal, previously 1.3. This nodule does NOT meet TI-RADS criteria for biopsy or dedicated follow-up.   IMPRESSION: 1. Heterogenous thyroid with stable left nodules. None currently meets criteria for biopsy or follow-up.   The above is in keeping with the ACR TI-RADS recommendations - J Am Coll Radiol 2017;14:587-595.     Electronically Signed   By: Corlis Leak  M.D.   On: 10/21/2020 12:57   Assessment & Plan:   1. Multinodular goiter The biopsy of the two suspicious nodules show benign follicular nodules.  Her repeat thyroid ultrasound shows stable nodules, not requiring additional biopsy or dedicated follow up.  2. Subclinical Hyperthyroidism -Her previsit thyroid function tests after stopping the Methimazole are slowly starting to revert back to the overactive side.  It has been over 1 year since her initial uptake and scan was done showing potential early toxic MNG.  I recommended repeating the uptake and scan to re-evaluate for this and she agrees.  She is advised to stay off the Methimazole for now until the uptake and scan is complete.   -Patient is advised to maintain close follow up with Kirstie Peri, MD for primary care needs.      I spent 20 minutes in the care of the patient today including review of labs from Thyroid Function, CMP, and  other relevant labs ; imaging/biopsy records (current and previous including abstractions from other facilities); face-to-face time discussing  her lab results and symptoms, medications doses, her options of short and long term treatment based on the latest standards of care / guidelines;   and documenting the encounter.  Zoe Webb  participated in the discussions, expressed understanding, and voiced agreement with the above plans.  All questions were answered to her satisfaction. she is encouraged to contact clinic should she have any questions or concerns prior to her return visit.    Follow up plan: Return if symptoms worsen or fail to improve, for Thyroid follow up; uptake and scan.  I will call patient with results of uptake and scan and discuss how to proceed from there.   Ronny Bacon, [facility] [address] [place], [place] [address] Phone: [phone] Fax: [phone]   12/03/2020, 9:36 AM

## 2020-12-17 ENCOUNTER — Other Ambulatory Visit: Payer: Self-pay

## 2020-12-17 ENCOUNTER — Encounter (HOSPITAL_COMMUNITY)
Admission: RE | Admit: 2020-12-17 | Discharge: 2020-12-17 | Disposition: A | Discharge status: Home / Self Care | Payer: Medicare Other | Source: Ambulatory Visit | Attending: Nurse Practitioner | Admitting: Nurse Practitioner

## 2020-12-17 DIAGNOSIS — E0590 Thyrotoxicosis, unspecified without thyrotoxic crisis or storm: Secondary | ICD-10-CM | POA: Insufficient documentation

## 2020-12-17 MED ORDER — SODIUM IODIDE I-123 7.4 MBQ CAPS
424.0000 | ORAL_CAPSULE | Freq: Once | ORAL | Status: AC
  Administered 2020-12-17: 424 via ORAL

## 2020-12-18 ENCOUNTER — Encounter (HOSPITAL_COMMUNITY)
Admission: RE | Admit: 2020-12-18 | Discharge: 2020-12-18 | Disposition: A | Discharge status: Home / Self Care | Payer: Medicare Other | Source: Ambulatory Visit | Attending: Nurse Practitioner | Admitting: Nurse Practitioner

## 2020-12-18 DIAGNOSIS — E041 Nontoxic single thyroid nodule: Secondary | ICD-10-CM | POA: Diagnosis not present

## 2020-12-21 NOTE — Addendum Note (Signed)
Addended by: Dani Gobble on: 12/21/2020 11:54 AM   Modules accepted: Orders

## 2020-12-21 NOTE — Progress Notes (Signed)
Please call patient and let her know that the repeat uptake and scan shows increased absorption of the iodine, which is now consistent with Graves Disease.  Last years scan was too early to tell, but now we finally have a definitive diagnosis.  She needs Radioactive iodine ablation.  If she is ok to proceed, I will go ahead and enter the orders.  She has to be off the Methimazole for 5 days prior to the treatment.  We will have to monitor blood work 6 weeks after the treatment to monitor effectiveness and when to start thyroid hormone replacement.

## 2020-12-29 NOTE — Written Directive (Addendum)
MOLECULAR IMAGING AND THERAPEUTICS WRITTEN DIRECTIVE   PATIENT NAME: Zoe Webb  PT DOB:   1950-03-25                                              MRN: [id number]  ---------------------------------------------------------------------------------------------------------------------   I-131 WHOLE THYROID THERAPY (NON-CANCER)    RADIOPHARMACEUTICAL:   Iodine-131 Capsule    PRESCRIBED DOSE FOR ADMINISTRATION:  20 mCi (twenty millicuries)   ROUTE OFADMINISTRATION: PO   DIAGNOSIS:  Graves disease   REFERRING PHYSICIAN:  [name] Reardon   TSH:    Lab Results  Component Value Date   TSH 0.058 (L) 11/30/2020   TSH 1.390 08/18/2020   TSH 0.821 05/27/2020     PRIOR I-131 THERAPY (Date and Dose):   PRIOR RADIOLOGY EXAMS (Results and Date): NM THYROID MULT UPTAKE W/IMAGING  Result Date: 12/18/2020 CLINICAL DATA:  Suppressed TSH, irritability for 1 year, TSH 0.058, question multinodular goiter EXAM: THYROID SCAN AND UPTAKE - 4 AND 24 HOURS TECHNIQUE: Following oral administration of I-123 capsule, anterior planar imaging was acquired at 24 hours. Thyroid uptake was calculated with a thyroid probe at 4-6 hours and 24 hours. RADIOPHARMACEUTICALS:  424 uCi I-123 sodium iodide p.o. COMPARISON:  Thyroid ultrasound 08/12/2019 FINDINGS: Asymmetric thyroid lobes LEFT larger than RIGHT. Homogeneous tracer distribution in both thyroid lobes. Tiny RIGHT upper lobe cold nodule. No other focal areas of increased or decreased tracer uptake. 4 hour I-123 uptake = 18.9% (normal 5-20%) 24 hour I-123 uptake = 32.9% (normal 10-30%) IMPRESSION: Homogeneous uptake in the thyroid lobes bilaterally with a single subtle cold nodule at the lateral aspect of the RIGHT lobe. Elevated 24 hour radio iodine uptake consistent with hyperthyroidism. Overall findings are most consistent with Graves disease with a single tiny nonspecific RIGHT thyroid nodule. Electronically Signed   By: Ulyses Southward M.D.   On:  12/18/2020 15:35   NM THYROID MULT UPTAKE W/IMAGING  Result Date: 10/29/2019 CLINICAL DATA:  Hyperthyroidism.  Serum TSH 0.062. EXAM: THYROID SCAN AND UPTAKE - 4 AND 24 HOURS TECHNIQUE: Following oral administration of I-123 capsule, anterior planar imaging was acquired at 24 hours. Thyroid uptake was calculated with a thyroid probe at 4-6 hours and 24 hours. RADIOPHARMACEUTICALS:  460.0 uCi I-123 sodium iodide p.o. COMPARISON:  Thyroid ultrasound 08/12/2019 FINDINGS: Minimal thyroid heterogeneity without focal hot or cold nodules. 4 hour I-123 uptake = 14.1% (normal 5-20%) 24 hour I-123 uptake = 26.0% (normal 10-30%) IMPRESSION: 1. Normal radioactive iodine uptake at 4 and 24 hours. 2. Minimally heterogeneous appearance of the gland without focal hot or cold nodules. 3. Based on prior ultrasound appearance, findings could reflect subacute thyroiditis or mild/early toxic multinodular goiter. Electronically Signed   By: Carey Bullocks M.D.   On: 10/29/2019 13:43   US THYROID  Result Date: 10/21/2020 CLINICAL DATA:  Subclinical hyperthyroidism, multinodular goiter. Previous FNA biopsy of mid left and superior left nodules 10/10/2019. EXAM: THYROID ULTRASOUND TECHNIQUE: Ultrasound examination of the thyroid gland and adjacent soft tissues was performed. COMPARISON:  08/12/2019 FINDINGS: Parenchymal Echotexture: Markedly heterogenous Isthmus: 0.2 cm thickness, previously 0.3 Right lobe: 4.7 x 2.2 x 1.9 cm, previously 4.6 x 1.7 x 2 Left lobe: 5.7 x 2.5 x 2.8 cm, previously 6 x 6 x 2.9 x 2.8 _________________________________________________________ Estimated total number of nodules >/= 1 cm: 3 Number of spongiform nodules >/=  2 cm not described  below (TR1): 0 Number of mixed cystic and solid nodules >/= 1.5 cm not described below (TR2): 0 _________________________________________________________ Nodule 1: 1.4 x 1.3 x 0.9 cm superior left, previously 1.8 x 1.4 x 1. This was previously biopsied. Nodule 2: 3.1 x  2.3 x 2.1 cm mid left, previously 3.4 x 2.4 x 2.0; this was previously biopsied. Nodule 3: 1.2 cm benign colloid cyst, inferior left, with no internal flow signal, previously 1.3. This nodule does NOT meet TI-RADS criteria for biopsy or dedicated follow-up. IMPRESSION: 1. Heterogenous thyroid with stable left nodules. None currently meets criteria for biopsy or follow-up. The above is in keeping with the ACR TI-RADS recommendations - J Am Coll Radiol 2017;14:587-595. Electronically Signed   By: Corlis Leak M.D.   On: 10/21/2020 12:57      ADDITIONAL PHYSICIAN COMMENTS/NOTES   AUTHORIZED USER SIGNATURE & TIME STAMP:

## 2021-01-15 ENCOUNTER — Other Ambulatory Visit: Payer: Self-pay

## 2021-01-15 ENCOUNTER — Encounter (HOSPITAL_COMMUNITY)
Admission: RE | Admit: 2021-01-15 | Discharge: 2021-01-15 | Disposition: A | Discharge status: Home / Self Care | Payer: Medicare Other | Source: Ambulatory Visit | Attending: Nurse Practitioner | Admitting: Nurse Practitioner

## 2021-01-15 DIAGNOSIS — E0590 Thyrotoxicosis, unspecified without thyrotoxic crisis or storm: Secondary | ICD-10-CM | POA: Diagnosis not present

## 2021-01-15 MED ORDER — SODIUM IODIDE I 131 CAPSULE
20.0000 | Freq: Once | INTRAVENOUS | Status: AC | PRN
  Administered 2021-01-15: 20 via ORAL

## 2021-01-21 ENCOUNTER — Other Ambulatory Visit: Payer: Self-pay | Admitting: Nurse Practitioner

## 2021-01-21 DIAGNOSIS — E0590 Thyrotoxicosis, unspecified without thyrotoxic crisis or storm: Secondary | ICD-10-CM

## 2021-03-18 DIAGNOSIS — E0590 Thyrotoxicosis, unspecified without thyrotoxic crisis or storm: Secondary | ICD-10-CM | POA: Diagnosis not present

## 2021-03-19 LAB — T3, FREE: T3, Free: 3 pg/mL (ref 2.0–4.4)

## 2021-03-19 LAB — TSH: TSH: 2.41 u[IU]/mL (ref 0.450–4.500)

## 2021-03-19 LAB — T4, FREE: Free T4: 1.14 ng/dL (ref 0.82–1.77)

## 2021-03-22 ENCOUNTER — Other Ambulatory Visit: Payer: Self-pay

## 2021-03-22 ENCOUNTER — Ambulatory Visit: Payer: Medicare Other | Admitting: Nurse Practitioner

## 2021-03-22 ENCOUNTER — Encounter: Payer: Self-pay | Admitting: Nurse Practitioner

## 2021-03-22 VITALS — BP 125/77 | HR 84 | Ht 65.0 in | Wt 194.0 lb

## 2021-03-22 DIAGNOSIS — E042 Nontoxic multinodular goiter: Secondary | ICD-10-CM | POA: Diagnosis not present

## 2021-03-22 DIAGNOSIS — E0590 Thyrotoxicosis, unspecified without thyrotoxic crisis or storm: Secondary | ICD-10-CM

## 2021-03-22 NOTE — Progress Notes (Signed)
03/22/2021     Endocrinology Follow Up Visit    Subjective:    Patient ID: Zoe Webb, female    DOB: 13-Jun-1950, PCP Kirstie Peri, MD.   Past Medical History:  Diagnosis Date   Hypertension    Osteopenia    Vitamin D deficiency     Past Surgical History:  Procedure Laterality Date   CYST REMOVAL NECK  74-75   TUBAL LIGATION  1979    Social History   Socioeconomic History   Marital status: Widowed    Spouse name: Not on file   Number of children: Not on file   Years of education: Not on file   Highest education level: Not on file  Occupational History   Not on file  Tobacco Use   Smoking status: Never   Smokeless tobacco: Never  Vaping Use   Vaping Use: Never used  Substance and Sexual Activity   Alcohol use: Not Currently   Drug use: Never   Sexual activity: Not Currently  Other Topics Concern   Not on file  Social History Narrative   Not on file   Social Determinants of Health   Financial Resource Strain: Not on file  Food Insecurity: Not on file  Transportation Needs: Not on file  Physical Activity: Not on file  Stress: Not on file  Social Connections: Not on file    Family History  Problem Relation Age of Onset   Cancer Father    Thyroid disease Sister     Outpatient Encounter Medications as of 03/22/2021  Medication Sig   alendronate (FOSAMAX) 70 MG tablet Take 70 mg by mouth once a week.   lisinopril-hydrochlorothiazide (ZESTORETIC) 20-12.5 MG tablet Take 1 tablet by mouth daily.   Vitamin D, Ergocalciferol, (DRISDOL) 1.25 MG (50000 UNIT) CAPS capsule Take 50,000 Units by mouth once a week.   No facility-administered encounter medications on file as of 03/22/2021.    ALLERGIES: No Known Allergies  VACCINATION STATUS:  There is no immunization history on file for this patient.   HPI  Zoe Webb is 71 y.o. female who presents today with a medical history as above. she is being seen in follow up after being seen in  consultation for hyperthyroidism as requested by Kirstie Peri, MD.  she has been dealing with symptoms of fatigue, heat intolerance, weight gain, and alternating constipation/diarrhea for approximately 1 year.   Her recent thyroid function tests reviewed.  she denies dysphagia, choking, shortness of breath, no recent voice change. She has no new complaints at this time and reports some improvements in some of her symptoms without intervention.   she does have a family history of thyroid dysfunction in her sister (hypothyroidism), but denies family hx of thyroid cancer. she denies personal history of goiter. she is not on any anti-thyroid medications nor on any thyroid hormone supplements at this time.  She had previously taken levothyroxine, prescribed by her PCP, but was taken off due to fluctuations in her thyroid labs.  Review of systems  Constitutional: + Minimally fluctuating body weight,  current Body mass index is 32.28 kg/m. , no fatigue, no subjective hyperthermia, no subjective hypothermia Eyes: no blurry vision, no xerophthalmia ENT: no sore throat, no nodules palpated in throat, no dysphagia/odynophagia, no hoarseness Cardiovascular: no chest pain, no shortness of breath, no palpitations, no leg swelling Respiratory: no cough, no shortness of breath Gastrointestinal: no nausea/vomiting/diarrhea Musculoskeletal: no muscle/joint aches Skin: no rashes, no hyperemia Neurological: no tremors, no  numbness, no tingling, no dizziness Psychiatric: no depression, no anxiety   Objective:    BP 125/77    Pulse 84    Ht 5\' 5"  (1.651 m)    Wt 194 lb (88 kg)    BMI 32.28 kg/m   Wt Readings from Last 3 Encounters:  03/22/21 194 lb (88 kg)  12/03/20 192 lb 3.2 oz (87.2 kg)  08/31/20 194 lb (88 kg)    BP Readings from Last 3 Encounters:  03/22/21 125/77  12/03/20 122/72  06/01/20 118/76     Physical Exam- Limited  Constitutional:  Body mass index is 32.28 kg/m. , not in acute  distress, normal state of mind Eyes:  EOMI, no exophthalmos Neck: Supple Cardiovascular: RRR, no murmurs, rubs, or gallops, no edema Respiratory: Adequate breathing efforts, no crackles, rales, rhonchi, or wheezing Musculoskeletal: no gross deformities, strength intact in all four extremities, no gross restriction of joint movements Skin:  no rashes, no hyperemia Neurological: no tremor with outstretched hands   CMP  No results found for: NA, K, CL, CO2, GLUCOSE, BUN, CREATININE, CALCIUM, PROT, ALBUMIN, AST, ALT, ALKPHOS, BILITOT, GFRNONAA, GFRAA   CBC No results found for: WBC, RBC, HGB, HCT, PLT, MCV, MCH, MCHC, RDW, LYMPHSABS, MONOABS, EOSABS, BASOSABS   Diabetic Labs (most recent): No results found for: HGBA1C  Lipid Panel  No results found for: CHOL, TRIG, HDL, CHOLHDL, VLDL, LDLCALC, LDLDIRECT, LABVLDL   Lab Results  Component Value Date   TSH 2.410 03/18/2021   TSH 0.058 (L) 11/30/2020   TSH 1.390 08/18/2020   TSH 0.821 05/27/2020   TSH 0.035 (L) 02/26/2020   TSH 0.062 (L) 10/03/2019   FREET4 1.14 03/18/2021   FREET4 1.51 11/30/2020   FREET4 1.03 08/18/2020   FREET4 1.39 05/27/2020   FREET4 1.52 02/26/2020   FREET4 1.35 10/03/2019      Thyroid ultrasound performed on 08/12/2019 at [facility] health:  Right lobe measuring 4.6 x 0.9 x 2.0 cm Left lobe measuring 6.6 x 2.9 x 2.8 cm Estimated total number of nodules greater than or equal to 1 cm: 3  Nodule 1: Location is in the left mid lobe measuring 3.4 x 2.0 x 2.4 cm Composition is solid/almost completely solid Echogenicity is isoechoic Ill-defined margins ACR TI-RADS risk category 3 Given size and appearance, fine-needle aspiration of this mildly suspicious nodule should be considered based on TI-RADS criteria.  Nodule 2: Location is left superior lobe measuring 1.8 x 1 x 1.4 cm Composition is solid/almost completely solid Echogenicity is hypoechoic Ill-defined margins ACR TI-RADS risk category 4-6 Given  size and appearance fine-needle aspiration of this moderately suspicious nodule should be considered based on TI-RADS criteria  Nodule 3: Location is left inferior lobe measuring 1.3 x 1 x 0.8 cm Composition is solid/almost completely solid Echogenicity is hypoechoic Ill-defined margins ACR TI-RADS risk category 4-6 Given size and appearance, a follow-up ultrasound in 1 year should be considered based on TI-RADS criteria --------------------------------------------------------------------------------------------------------------  Pathology Report: 10/10/19 NODULE 1: Clinical History: 3.4 cm LML  Specimen Submitted:  A. THYROID, LEFT MID, FINE NEEDLE ASPIRATION:   FINAL MICROSCOPIC DIAGNOSIS:  - Consistent with benign follicular nodule (Bethesda category II)   SPECIMEN ADEQUACY:  Satisfactory for evaluation   GROSS:  Received is/are 3 slides in 95% Ethyl alcohol, 3 air dried slides for  diff stain, and 30 ccs of pale pink Cytolyt solution. (CM:cm)  Prepared:  Smears:  6  Concentration Method (Thin Prep):  1  Cell Block:  Cell block attempted, not  obtained.  Additional Studies:  Also there was an Afirma collected.  NODULE 2: Clinical History: 1.8 cm LUL  Specimen Submitted:  A. THYROID, LEFT SUPERIOR, FINE NEEDLE ASPIRATION:    FINAL MICROSCOPIC DIAGNOSIS:  - Consistent with benign follicular nodule (Bethesda category II)   SPECIMEN ADEQUACY:  Satisfactory for evaluation   GROSS:  Received is/are 3 slides in 95% Ethyl alcohol, 3 air dried slides for  diff stain, and 30 ccs of clear colorless Cytolyt solution. (CM:cm)  Prepared:  Smears:  6  Concentration Method (Thin Prep):  1  Cell Block:  Cell block attempted, not obtained.  Additional Studies:  Also there was an Afirma collected.  ------------------------------------------------------------------------------------------------- Uptake and Scan results from 10/29/19 EXAM: THYROID SCAN AND UPTAKE - 4 AND 24 HOURS    TECHNIQUE: Following oral administration of I-123 capsule, anterior planar imaging was acquired at 24 hours. Thyroid uptake was calculated with a thyroid probe at 4-6 hours and 24 hours.   RADIOPHARMACEUTICALS:  460.0 uCi I-123 sodium iodide p.o.   COMPARISON:  Thyroid ultrasound 08/12/2019   FINDINGS: Minimal thyroid heterogeneity without focal hot or cold nodules.   4 hour I-123 uptake = 14.1% (normal 5-20%)   24 hour I-123 uptake = 26.0% (normal 10-30%)   IMPRESSION: 1. Normal radioactive iodine uptake at 4 and 24 hours. 2. Minimally heterogeneous appearance of the gland without focal hot or cold nodules. 3. Based on prior ultrasound appearance, findings could reflect subacute thyroiditis or mild/early toxic multinodular goiter.    ------------------------------------------------------------------------------------------------------------------------- Thyroid US from 10/21/20 CLINICAL DATA:  Subclinical hyperthyroidism, multinodular goiter. Previous FNA biopsy of mid left and superior left nodules 10/10/2019.   EXAM: THYROID ULTRASOUND   TECHNIQUE: Ultrasound examination of the thyroid gland and adjacent soft tissues was performed.   COMPARISON:  08/12/2019   FINDINGS: Parenchymal Echotexture: Markedly heterogenous   Isthmus: 0.2 cm thickness, previously 0.3   Right lobe: 4.7 x 2.2 x 1.9 cm, previously 4.6 x 1.7 x 2   Left lobe: 5.7 x 2.5 x 2.8 cm, previously 6 x 6 x 2.9 x 2.8   _________________________________________________________   Estimated total number of nodules >/= 1 cm: 3   Number of spongiform nodules >/=  2 cm not described below (TR1): 0   Number of mixed cystic and solid nodules >/= 1.5 cm not described below (TR2): 0   _________________________________________________________   Nodule 1: 1.4 x 1.3 x 0.9 cm superior left, previously 1.8 x 1.4 x 1. This was previously biopsied.   Nodule 2: 3.1 x 2.3 x 2.1 cm mid left, previously 3.4 x  2.4 x 2.0; this was previously biopsied.   Nodule 3: 1.2 cm benign colloid cyst, inferior left, with no internal flow signal, previously 1.3. This nodule does NOT meet TI-RADS criteria for biopsy or dedicated follow-up.   IMPRESSION: 1. Heterogenous thyroid with stable left nodules. None currently meets criteria for biopsy or follow-up.   The above is in keeping with the ACR TI-RADS recommendations - J Am Coll Radiol 2017;14:587-595.     Electronically Signed   By: Corlis Leak M.D.   On: 10/21/2020 12:57    Repeat Uptake and scan from 12/18/20 CLINICAL DATA:  Suppressed TSH, irritability for 1 year, TSH 0.058, question multinodular goiter   EXAM: THYROID SCAN AND UPTAKE - 4 AND 24 HOURS   TECHNIQUE: Following oral administration of I-123 capsule, anterior planar imaging was acquired at 24 hours. Thyroid uptake was calculated with a thyroid probe at 4-6 hours and 24 hours.  RADIOPHARMACEUTICALS:  424 uCi I-123 sodium iodide p.o.   COMPARISON:  Thyroid ultrasound 08/12/2019   FINDINGS: Asymmetric thyroid lobes LEFT larger than RIGHT.   Homogeneous tracer distribution in both thyroid lobes.   Tiny RIGHT upper lobe cold nodule.   No other focal areas of increased or decreased tracer uptake.   4 hour I-123 uptake = 18.9% (normal 5-20%)   24 hour I-123 uptake = 32.9% (normal 10-30%)   IMPRESSION: Homogeneous uptake in the thyroid lobes bilaterally with a single subtle cold nodule at the lateral aspect of the RIGHT lobe.   Elevated 24 hour radio iodine uptake consistent with hyperthyroidism.   Overall findings are most consistent with Graves disease with a single tiny nonspecific RIGHT thyroid nodule.     Electronically Signed   By: Ulyses SouthwardMark  Boles M.D.   On: 12/18/2020 15:35    Latest Reference Range & Units 11/30/20 09:08 03/18/21 08:30  TSH 0.450 - 4.500 uIU/mL 0.058 (L) 2.410  Triiodothyronine,Free,Serum 2.0 - 4.4 pg/mL 3.9 3.0  T4,Free(Direct) 0.82 -  1.77 ng/dL 6.961.51 2.951.14  (L): Data is abnormally low Assessment & Plan:   1. Multinodular goiter The biopsy of the two suspicious nodules show benign follicular nodules.  Her repeat thyroid ultrasound shows stable nodules, not requiring additional biopsy or dedicated follow up.    2. Hyperthyroidism- r/t Graves Disease -She is s/p RAI ablation for the second time on 01/15/21.  Her previsit thyroid function tests show improvement, not yet at a level where thyroid hormone replacement is needed.  Will repeat TFTs in 8 weeks for surveillance and timing of thyroid hormone replacement therapy.     -Patient is advised to maintain close follow up with Kirstie PeriShah, Ashish, MD for primary care needs.     I spent 25 minutes in the care of the patient today including review of labs from Thyroid Function, CMP, and other relevant labs ; imaging/biopsy records (current and previous including abstractions from other facilities); face-to-face time discussing  her lab results and symptoms, medications doses, her options of short and long term treatment based on the latest standards of care / guidelines;   and documenting the encounter.  Zoe DodgeLinda G Rea  participated in the discussions, expressed understanding, and voiced agreement with the above plans.  All questions were answered to her satisfaction. she is encouraged to contact clinic should she have any questions or concerns prior to her return visit.    Follow up plan: Return in about 8 weeks (around 05/17/2021) for Thyroid follow up, Previsit labs.     Ronny BaconWhitney Therasa Lorenzi, [facility] SystemFNP-BC [facility] [address] [place], [place] [address] Phone: [phone] Fax: [phone]   03/22/2021, 10:52 AM

## 2021-03-31 DIAGNOSIS — M8580 Other specified disorders of bone density and structure, unspecified site: Secondary | ICD-10-CM | POA: Diagnosis not present

## 2021-03-31 DIAGNOSIS — E039 Hypothyroidism, unspecified: Secondary | ICD-10-CM | POA: Diagnosis not present

## 2021-03-31 DIAGNOSIS — Z299 Encounter for prophylactic measures, unspecified: Secondary | ICD-10-CM | POA: Diagnosis not present

## 2021-03-31 DIAGNOSIS — I10 Essential (primary) hypertension: Secondary | ICD-10-CM | POA: Diagnosis not present

## 2021-04-28 DIAGNOSIS — Z299 Encounter for prophylactic measures, unspecified: Secondary | ICD-10-CM | POA: Diagnosis not present

## 2021-04-28 DIAGNOSIS — E7800 Pure hypercholesterolemia, unspecified: Secondary | ICD-10-CM | POA: Diagnosis not present

## 2021-04-28 DIAGNOSIS — Z7189 Other specified counseling: Secondary | ICD-10-CM | POA: Diagnosis not present

## 2021-04-28 DIAGNOSIS — R5383 Other fatigue: Secondary | ICD-10-CM | POA: Diagnosis not present

## 2021-04-28 DIAGNOSIS — Z79899 Other long term (current) drug therapy: Secondary | ICD-10-CM | POA: Diagnosis not present

## 2021-04-28 DIAGNOSIS — I10 Essential (primary) hypertension: Secondary | ICD-10-CM | POA: Diagnosis not present

## 2021-04-28 DIAGNOSIS — Z0000 Encounter for general adult medical examination without abnormal findings: Secondary | ICD-10-CM | POA: Diagnosis not present

## 2021-05-13 DIAGNOSIS — E042 Nontoxic multinodular goiter: Secondary | ICD-10-CM | POA: Diagnosis not present

## 2021-05-13 DIAGNOSIS — E0590 Thyrotoxicosis, unspecified without thyrotoxic crisis or storm: Secondary | ICD-10-CM | POA: Diagnosis not present

## 2021-05-14 LAB — TSH: TSH: 4.4 u[IU]/mL (ref 0.450–4.500)

## 2021-05-14 LAB — T3, FREE: T3, Free: 2.9 pg/mL (ref 2.0–4.4)

## 2021-05-14 LAB — T4, FREE: Free T4: 1.08 ng/dL (ref 0.82–1.77)

## 2021-05-20 ENCOUNTER — Ambulatory Visit: Payer: Medicare Other | Admitting: Nurse Practitioner

## 2021-05-20 ENCOUNTER — Encounter: Payer: Self-pay | Admitting: Nurse Practitioner

## 2021-05-20 VITALS — BP 105/69 | HR 67 | Ht 65.0 in | Wt 199.4 lb

## 2021-05-20 DIAGNOSIS — E042 Nontoxic multinodular goiter: Secondary | ICD-10-CM | POA: Diagnosis not present

## 2021-05-20 DIAGNOSIS — E0590 Thyrotoxicosis, unspecified without thyrotoxic crisis or storm: Secondary | ICD-10-CM

## 2021-05-20 NOTE — Progress Notes (Signed)
? ? ? 05/20/2021   ? ? ?Endocrinology Follow Up Visit ? ? ? ?Subjective:  ? ? Patient ID: Zoe DodgeLinda G Webb, female    DOB: [birth year], PCP Zoe Webb, Ashish, MD. ? ? ?Past Medical History:  ?Diagnosis Date  ? Hypertension   ? Osteopenia   ? Vitamin D deficiency   ? ? ?Past Surgical History:  ?Procedure Laterality Date  ? CYST REMOVAL NECK  74-75  ? TUBAL LIGATION  1979  ? ? ?Social History  ? ?Socioeconomic History  ? Marital status: Widowed  ?  Spouse name: Not on file  ? Number of children: Not on file  ? Years of education: Not on file  ? Highest education level: Not on file  ?Occupational History  ? Not on file  ?Tobacco Use  ? Smoking status: Never  ? Smokeless tobacco: Never  ?Vaping Use  ? Vaping Use: Never used  ?Substance and Sexual Activity  ? Alcohol use: Not Currently  ? Drug use: Never  ? Sexual activity: Not Currently  ?Other Topics Concern  ? Not on file  ?Social History Narrative  ? Not on file  ? ?Social Determinants of Health  ? ?Financial Resource Strain: Not on file  ?Food Insecurity: Not on file  ?Transportation Needs: Not on file  ?Physical Activity: Not on file  ?Stress: Not on file  ?Social Connections: Not on file  ? ? ?Family History  ?Problem Relation Age of Onset  ? Cancer Father   ? Thyroid disease Sister   ? ? ?Outpatient Encounter Medications as of 05/20/2021  ?Medication Sig  ? alendronate (FOSAMAX) 70 MG tablet Take 70 mg by mouth once a week.  ? lisinopril-hydrochlorothiazide (ZESTORETIC) 20-12.5 MG tablet Take 1 tablet by mouth daily.  ? Vitamin D, Ergocalciferol, (DRISDOL) 1.25 MG (50000 UNIT) CAPS capsule Take 50,000 Units by mouth once a week.  ? ?No facility-administered encounter medications on file as of 05/20/2021.  ? ? ?ALLERGIES: ?No Known Allergies ? ?VACCINATION STATUS: ? ?There is no immunization history on file for this patient. ? ? ?HPI ? ?Zoe Webb is 71 y.o. female who presents today with a medical history as above. she is being seen in follow up after being seen in  consultation for hyperthyroidism as requested by Zoe Webb, Ashish, MD.  she has been dealing with symptoms of fatigue, heat intolerance, weight gain, and alternating constipation/diarrhea for approximately 1 year.  ? ?Her recent thyroid function tests were reviewed. ? ?she denies dysphagia, choking, shortness of breath, no recent voice change. She has no new complaints at this time and reports some improvements in some of her symptoms without intervention. ?  ?she does have a family history of thyroid dysfunction in her sister (hypothyroidism), but denies family hx of thyroid cancer. she denies personal history of goiter. she is not on any anti-thyroid medications nor on any thyroid hormone supplements at this time.  She had previously taken levothyroxine, prescribed by her PCP, but was taken off due to fluctuations in her thyroid labs. ? ?Review of systems ? ?Constitutional: + weight gain,  current Body mass index is 33.18 kg/m?. , + fatigue, no subjective hyperthermia, no subjective hypothermia ?Eyes: no blurry vision, no xerophthalmia ?ENT: no sore throat, no nodules palpated in throat, no dysphagia/odynophagia, no hoarseness ?Cardiovascular: no chest pain, no shortness of breath, no palpitations, no leg swelling ?Respiratory: no cough, no shortness of breath ?Gastrointestinal: no nausea/vomiting/diarrhea ?Musculoskeletal: no muscle/joint aches ?Skin: no rashes, no hyperemia ?Neurological: no tremors, no numbness,  no tingling, no dizziness ?Psychiatric: + depression-situational, no anxiety ? ? ?Objective:  ?  ?BP 105/69   Pulse 67   Ht 5\' 5"  (1.651 m)   Wt 199 lb 6.4 oz (90.4 kg)   SpO2 98%   BMI 33.18 kg/m?   ?Wt Readings from Last 3 Encounters:  ?05/20/21 199 lb 6.4 oz (90.4 kg)  ?03/22/21 194 lb (88 kg)  ?12/03/20 192 lb 3.2 oz (87.2 kg)  ?  ?BP Readings from Last 3 Encounters:  ?05/20/21 105/69  ?03/22/21 125/77  ?12/03/20 122/72  ? ? ? ?Physical Exam- Limited ? ?Constitutional:  Body mass index is 33.18  kg/m?. , not in acute distress, normal state of mind ?Eyes:  EOMI, no exophthalmos ?Neck: Supple ?Cardiovascular: RRR, no murmurs, rubs, or gallops, no edema ?Respiratory: Adequate breathing efforts, no crackles, rales, rhonchi, or wheezing ?Musculoskeletal: no gross deformities, strength intact in all four extremities, no gross restriction of joint movements ?Skin:  no rashes, no hyperemia ?Neurological: no tremor with outstretched hands ? ? ?CMP  ?No results found for: NA, K, CL, CO2, GLUCOSE, BUN, CREATININE, CALCIUM, PROT, ALBUMIN, AST, ALT, ALKPHOS, BILITOT, GFRNONAA, GFRAA ? ? ?CBC ?No results found for: WBC, RBC, HGB, HCT, PLT, MCV, MCH, MCHC, RDW, LYMPHSABS, MONOABS, EOSABS, BASOSABS ? ? ?Diabetic Labs (most recent): ?No results found for: HGBA1C ? ?Lipid Panel  ?No results found for: CHOL, TRIG, HDL, CHOLHDL, VLDL, LDLCALC, LDLDIRECT, LABVLDL ? ? ?Lab Results  ?Component Value Date  ? TSH 4.400 05/13/2021  ? TSH 2.410 03/18/2021  ? TSH 0.058 (L) 11/30/2020  ? TSH 1.390 08/18/2020  ? TSH 0.821 05/27/2020  ? TSH 0.035 (L) 02/26/2020  ? TSH 0.062 (L) 10/03/2019  ? FREET4 1.08 05/13/2021  ? FREET4 1.14 03/18/2021  ? FREET4 1.51 11/30/2020  ? FREET4 1.03 08/18/2020  ? FREET4 1.39 05/27/2020  ? FREET4 1.52 02/26/2020  ? FREET4 1.35 10/03/2019  ?  ? ? ?Thyroid ultrasound performed on 08/12/2019 at [facility] health: ? ?Right lobe measuring 4.6 x 0.9 x 2.0 cm ?Left lobe measuring 6.6 x 2.9 x 2.8 cm ?Estimated total number of nodules greater than or equal to 1 cm: 3 ? ?Nodule 1: ?Location is in the left mid lobe measuring 3.4 x 2.0 x 2.4 cm ?Composition is solid/almost completely solid ?Echogenicity is isoechoic ?Ill-defined margins ?ACR TI-RADS risk category 3 ?Given size and appearance, fine-needle aspiration of this mildly suspicious nodule should be considered based on TI-RADS criteria. ? ?Nodule 2: ?Location is left superior lobe measuring 1.8 x 1 x 1.4 cm ?Composition is solid/almost completely solid ?Echogenicity is  hypoechoic ?Ill-defined margins ?ACR TI-RADS risk category 4-6 ?Given size and appearance fine-needle aspiration of this moderately suspicious nodule should be considered based on TI-RADS criteria ? ?Nodule 3: ?Location is left inferior lobe measuring 1.3 x 1 x 0.8 cm ?Composition is solid/almost completely solid ?Echogenicity is hypoechoic ?Ill-defined margins ?ACR TI-RADS risk category 4-6 ?Given size and appearance, a follow-up ultrasound in 1 year should be considered based on TI-RADS criteria ?-------------------------------------------------------------------------------------------------------------- ? ?Pathology Report: 10/10/19 ?NODULE 1: Clinical History: 3.4 cm LML  ?Specimen Submitted:  A. THYROID, LEFT MID, FINE NEEDLE ASPIRATION:  ? ?FINAL MICROSCOPIC DIAGNOSIS:  ?- Consistent with benign follicular nodule (Bethesda category II)  ? ?SPECIMEN ADEQUACY:  ?Satisfactory for evaluation  ? ?GROSS:  ?Received is/are 3 slides in 95% Ethyl alcohol, 3 air dried slides for  ?diff stain, and 30 ccs of pale pink Cytolyt solution. (CM:cm)  ?Prepared:  ?Smears:  6  ?Concentration  Method (Thin Prep):  1  ?Cell Block:  Cell block attempted, not obtained.  ?Additional Studies:  Also there was an Afirma collected. ? ?NODULE 2: Clinical History: 1.8 cm LUL  ?Specimen Submitted:  A. THYROID, LEFT SUPERIOR, FINE NEEDLE ASPIRATION:  ? ? ?FINAL MICROSCOPIC DIAGNOSIS:  ?- Consistent with benign follicular nodule (Bethesda category II)  ? ?SPECIMEN ADEQUACY:  ?Satisfactory for evaluation  ? ?GROSS:  ?Received is/are 3 slides in 95% Ethyl alcohol, 3 air dried slides for  ?diff stain, and 30 ccs of clear colorless Cytolyt solution. (CM:cm)  ?Prepared:  ?Smears:  6  ?Concentration Method (Thin Prep):  1  ?Cell Block:  Cell block attempted, not obtained.  ?Additional Studies:  Also there was an Afirma collected. ? ?------------------------------------------------------------------------------------------------- ?Uptake and Scan  results from 10/29/19 ?EXAM: ?THYROID SCAN AND UPTAKE - 4 AND 24 HOURS ?  ?TECHNIQUE: ?Following oral administration of I-123 capsule, anterior planar ?imaging was acquired at 24 hours. Thyroid uptake was calculate

## 2021-05-31 DIAGNOSIS — M818 Other osteoporosis without current pathological fracture: Secondary | ICD-10-CM | POA: Diagnosis not present

## 2021-06-29 DIAGNOSIS — E0590 Thyrotoxicosis, unspecified without thyrotoxic crisis or storm: Secondary | ICD-10-CM | POA: Diagnosis not present

## 2021-06-30 LAB — T3, FREE: T3, Free: 3 pg/mL (ref 2.0–4.4)

## 2021-06-30 LAB — T4, FREE: Free T4: 0.91 ng/dL (ref 0.82–1.77)

## 2021-06-30 LAB — TSH: TSH: 7.11 u[IU]/mL — ABNORMAL HIGH (ref 0.450–4.500)

## 2021-07-08 ENCOUNTER — Ambulatory Visit: Payer: Medicare Other | Admitting: Nurse Practitioner

## 2021-07-08 ENCOUNTER — Encounter: Payer: Self-pay | Admitting: Nurse Practitioner

## 2021-07-08 VITALS — BP 128/76 | HR 83 | Ht 65.0 in | Wt 202.0 lb

## 2021-07-08 DIAGNOSIS — E890 Postprocedural hypothyroidism: Secondary | ICD-10-CM

## 2021-07-08 MED ORDER — LEVOTHYROXINE SODIUM 50 MCG PO TABS: 50.0000 ug | ORAL_TABLET | Freq: Every day | ORAL | 1 refills | Status: DC

## 2021-07-08 NOTE — Progress Notes (Signed)
07/08/2021     Endocrinology Follow Up Visit    Subjective:    Patient ID: Zoe Webb, female    DOB: August 08, 1950, PCP Kirstie PeriShah, Ashish, MD.   Past Medical History:  Diagnosis Date   Hypertension    Osteopenia    Vitamin D deficiency     Past Surgical History:  Procedure Laterality Date   CYST REMOVAL NECK  74-75   TUBAL LIGATION  1979    Social History   Socioeconomic History   Marital status: Widowed    Spouse name: Not on file   Number of children: Not on file   Years of education: Not on file   Highest education level: Not on file  Occupational History   Not on file  Tobacco Use   Smoking status: Never   Smokeless tobacco: Never  Vaping Use   Vaping Use: Never used  Substance and Sexual Activity   Alcohol use: Not Currently   Drug use: Never   Sexual activity: Not Currently  Other Topics Concern   Not on file  Social History Narrative   Not on file   Social Determinants of Health   Financial Resource Strain: Not on file  Food Insecurity: Not on file  Transportation Needs: Not on file  Physical Activity: Not on file  Stress: Not on file  Social Connections: Not on file    Family History  Problem Relation Age of Onset   Cancer Father    Thyroid disease Sister     Outpatient Encounter Medications as of 07/08/2021  Medication Sig   levothyroxine (SYNTHROID) 50 MCG tablet Take 1 tablet (50 mcg total) by mouth daily before breakfast.   alendronate (FOSAMAX) 70 MG tablet Take 70 mg by mouth once a week.   lisinopril-hydrochlorothiazide (ZESTORETIC) 20-12.5 MG tablet Take 1 tablet by mouth daily.   Vitamin D, Ergocalciferol, (DRISDOL) 1.25 MG (50000 UNIT) CAPS capsule Take 50,000 Units by mouth once a week.   No facility-administered encounter medications on file as of 07/08/2021.    ALLERGIES: No Known Allergies  VACCINATION STATUS:  There is no immunization history on file for this patient.   HPI  Zoe Webb is 71 y.o. female  who presents today with a medical history as above. she is being seen in follow up after being seen in consultation for hyperthyroidism as requested by Kirstie PeriShah, Ashish, MD.  she has been dealing with symptoms of fatigue, heat intolerance, weight gain, and alternating constipation/diarrhea for approximately 1 year.   Her recent thyroid function tests were reviewed.  she denies dysphagia, choking, shortness of breath, no recent voice change. She has no new complaints at this time and reports some improvements in some of her symptoms without intervention.   she does have a family history of thyroid dysfunction in her sister (hypothyroidism), but denies family hx of thyroid cancer. she denies personal history of goiter. she is not on any anti-thyroid medications nor on any thyroid hormone supplements at this time.  She had previously taken levothyroxine, prescribed by her PCP, but was taken off due to fluctuations in her thyroid labs.  Review of systems  Constitutional: + weight gain,  current Body mass index is 33.61 kg/m. , + fatigue, no subjective hyperthermia, no subjective hypothermia Eyes: no blurry vision, no xerophthalmia ENT: no sore throat, no nodules palpated in throat, no dysphagia/odynophagia, no hoarseness Cardiovascular: no chest pain, no shortness of breath, no palpitations, no leg swelling Respiratory: no cough, no shortness of  breath Gastrointestinal: no nausea/vomiting/diarrhea Musculoskeletal: no muscle/joint aches Skin: no rashes, no hyperemia Neurological: no tremors, no numbness, no tingling, no dizziness Psychiatric: + depression-situational, no anxiety   Objective:    BP 128/76   Pulse 83   Ht 5\' 5"  (1.651 m)   Wt 202 lb (91.6 kg)   BMI 33.61 kg/m   Wt Readings from Last 3 Encounters:  07/08/21 202 lb (91.6 kg)  05/20/21 199 lb 6.4 oz (90.4 kg)  03/22/21 194 lb (88 kg)    BP Readings from Last 3 Encounters:  07/08/21 128/76  05/20/21 105/69  03/22/21 125/77      Physical Exam- Limited  Constitutional:  Body mass index is 33.61 kg/m. , not in acute distress, normal state of mind Eyes:  EOMI, no exophthalmos Neck: Supple Cardiovascular: RRR, no murmurs, rubs, or gallops, no edema Respiratory: Adequate breathing efforts, no crackles, rales, rhonchi, or wheezing Musculoskeletal: no gross deformities, strength intact in all four extremities, no gross restriction of joint movements Skin:  no rashes, no hyperemia Neurological: no tremor with outstretched hands   CMP  No results found for: NA, K, CL, CO2, GLUCOSE, BUN, CREATININE, CALCIUM, PROT, ALBUMIN, AST, ALT, ALKPHOS, BILITOT, GFRNONAA, GFRAA   CBC No results found for: WBC, RBC, HGB, HCT, PLT, MCV, MCH, MCHC, RDW, LYMPHSABS, MONOABS, EOSABS, BASOSABS   Diabetic Labs (most recent): No results found for: HGBA1C  Lipid Panel  No results found for: CHOL, TRIG, HDL, CHOLHDL, VLDL, LDLCALC, LDLDIRECT, LABVLDL   Lab Results  Component Value Date   TSH 7.110 (H) 06/29/2021   TSH 4.400 05/13/2021   TSH 2.410 03/18/2021   TSH 0.058 (L) 11/30/2020   TSH 1.390 08/18/2020   TSH 0.821 05/27/2020   TSH 0.035 (L) 02/26/2020   TSH 0.062 (L) 10/03/2019   FREET4 0.91 06/29/2021   FREET4 1.08 05/13/2021   FREET4 1.14 03/18/2021   FREET4 1.51 11/30/2020   FREET4 1.03 08/18/2020   FREET4 1.39 05/27/2020   FREET4 1.52 02/26/2020   FREET4 1.35 10/03/2019      Thyroid ultrasound performed on 08/12/2019 at [facility]:  Right lobe measuring 4.6 x 0.9 x 2.0 cm Left lobe measuring 6.6 x 2.9 x 2.8 cm Estimated total number of nodules greater than or equal to 1 cm: 3  Nodule 1: Location is in the left mid lobe measuring 3.4 x 2.0 x 2.4 cm Composition is solid/almost completely solid Echogenicity is isoechoic Ill-defined margins ACR TI-RADS risk category 3 Given size and appearance, fine-needle aspiration of this mildly suspicious nodule should be considered based on TI-RADS  criteria.  Nodule 2: Location is left superior lobe measuring 1.8 x 1 x 1.4 cm Composition is solid/almost completely solid Echogenicity is hypoechoic Ill-defined margins ACR TI-RADS risk category 4-6 Given size and appearance fine-needle aspiration of this moderately suspicious nodule should be considered based on TI-RADS criteria  Nodule 3: Location is left inferior lobe measuring 1.3 x 1 x 0.8 cm Composition is solid/almost completely solid Echogenicity is hypoechoic Ill-defined margins ACR TI-RADS risk category 4-6 Given size and appearance, a follow-up ultrasound in 1 year should be considered based on TI-RADS criteria --------------------------------------------------------------------------------------------------------------  Pathology Report: 10/10/19 NODULE 1: Clinical History: 3.4 cm LML  Specimen Submitted:  A. THYROID, LEFT MID, FINE NEEDLE ASPIRATION:   FINAL MICROSCOPIC DIAGNOSIS:  - Consistent with benign follicular nodule (Bethesda category II)   SPECIMEN ADEQUACY:  Satisfactory for evaluation   GROSS:  Received is/are 3 slides in 95% Ethyl alcohol, 3 air dried slides  for  diff stain, and 30 ccs of pale pink Cytolyt solution. (CM:cm)  Prepared:  Smears:  6  Concentration Method (Thin Prep):  1  Cell Block:  Cell block attempted, not obtained.  Additional Studies:  Also there was an Afirma collected.  NODULE 2: Clinical History: 1.8 cm LUL  Specimen Submitted:  A. THYROID, LEFT SUPERIOR, FINE NEEDLE ASPIRATION:    FINAL MICROSCOPIC DIAGNOSIS:  - Consistent with benign follicular nodule (Bethesda category II)   SPECIMEN ADEQUACY:  Satisfactory for evaluation   GROSS:  Received is/are 3 slides in 95% Ethyl alcohol, 3 air dried slides for  diff stain, and 30 ccs of clear colorless Cytolyt solution. (CM:cm)  Prepared:  Smears:  6  Concentration Method (Thin Prep):  1  Cell Block:  Cell block attempted, not obtained.  Additional Studies:  Also there was  an Afirma collected.  ------------------------------------------------------------------------------------------------- Uptake and Scan results from 10/29/19 EXAM: THYROID SCAN AND UPTAKE - 4 AND 24 HOURS   TECHNIQUE: Following oral administration of I-123 capsule, anterior planar imaging was acquired at 24 hours. Thyroid uptake was calculated with a thyroid probe at 4-6 hours and 24 hours.   RADIOPHARMACEUTICALS:  460.0 uCi I-123 sodium iodide p.o.   COMPARISON:  Thyroid ultrasound 08/12/2019   FINDINGS: Minimal thyroid heterogeneity without focal hot or cold nodules.   4 hour I-123 uptake = 14.1% (normal 5-20%)   24 hour I-123 uptake = 26.0% (normal 10-30%)   IMPRESSION: 1. Normal radioactive iodine uptake at 4 and 24 hours. 2. Minimally heterogeneous appearance of the gland without focal hot or cold nodules. 3. Based on prior ultrasound appearance, findings could reflect subacute thyroiditis or mild/early toxic multinodular goiter.    ------------------------------------------------------------------------------------------------------------------------- Thyroid US from 10/21/20 CLINICAL DATA:  Subclinical hyperthyroidism, multinodular goiter. Previous FNA biopsy of mid left and superior left nodules 10/10/2019.   EXAM: THYROID ULTRASOUND   TECHNIQUE: Ultrasound examination of the thyroid gland and adjacent soft tissues was performed.   COMPARISON:  08/12/2019   FINDINGS: Parenchymal Echotexture: Markedly heterogenous   Isthmus: 0.2 cm thickness, previously 0.3   Right lobe: 4.7 x 2.2 x 1.9 cm, previously 4.6 x 1.7 x 2   Left lobe: 5.7 x 2.5 x 2.8 cm, previously 6 x 6 x 2.9 x 2.8   _________________________________________________________   Estimated total number of nodules >/= 1 cm: 3   Number of spongiform nodules >/=  2 cm not described below (TR1): 0   Number of mixed cystic and solid nodules >/= 1.5 cm not described below (TR2): 0    _________________________________________________________   Nodule 1: 1.4 x 1.3 x 0.9 cm superior left, previously 1.8 x 1.4 x 1. This was previously biopsied.   Nodule 2: 3.1 x 2.3 x 2.1 cm mid left, previously 3.4 x 2.4 x 2.0; this was previously biopsied.   Nodule 3: 1.2 cm benign colloid cyst, inferior left, with no internal flow signal, previously 1.3. This nodule does NOT meet TI-RADS criteria for biopsy or dedicated follow-up.   IMPRESSION: 1. Heterogenous thyroid with stable left nodules. None currently meets criteria for biopsy or follow-up.   The above is in keeping with the ACR TI-RADS recommendations - J Am Coll Radiol 2017;14:587-595.     Electronically Signed   By: Corlis Leak M.D.   On: 10/21/2020 12:57    Repeat Uptake and scan from 12/18/20 CLINICAL DATA:  Suppressed TSH, irritability for 1 year, TSH 0.058, question multinodular goiter   EXAM: THYROID SCAN AND UPTAKE - 4  AND 24 HOURS   TECHNIQUE: Following oral administration of I-123 capsule, anterior planar imaging was acquired at 24 hours. Thyroid uptake was calculated with a thyroid probe at 4-6 hours and 24 hours.   RADIOPHARMACEUTICALS:  424 uCi I-123 sodium iodide p.o.   COMPARISON:  Thyroid ultrasound 08/12/2019   FINDINGS: Asymmetric thyroid lobes LEFT larger than RIGHT.   Homogeneous tracer distribution in both thyroid lobes.   Tiny RIGHT upper lobe cold nodule.   No other focal areas of increased or decreased tracer uptake.   4 hour I-123 uptake = 18.9% (normal 5-20%)   24 hour I-123 uptake = 32.9% (normal 10-30%)   IMPRESSION: Homogeneous uptake in the thyroid lobes bilaterally with a single subtle cold nodule at the lateral aspect of the RIGHT lobe.   Elevated 24 hour radio iodine uptake consistent with hyperthyroidism.   Overall findings are most consistent with Graves disease with a single tiny nonspecific RIGHT thyroid nodule.     Electronically Signed   By: Ulyses Southward M.D.   On: 12/18/2020 15:35   Latest Reference Range & Units 03/18/21 08:30 05/13/21 10:40 06/29/21 08:56  TSH 0.450 - 4.500 uIU/mL 2.410 4.400 7.110 (H)  Triiodothyronine,Free,Serum 2.0 - 4.4 pg/mL 3.0 2.9 3.0  T4,Free(Direct) 0.82 - 1.77 ng/dL 5.10 2.58 5.27  (H): Data is abnormally high (L): Data is abnormally low Assessment & Plan:   1. Multinodular goiter The biopsy of the two suspicious nodules show benign follicular nodules.  Her repeat thyroid ultrasound shows stable nodules, not requiring additional biopsy or dedicated follow up.    2. Hyperthyroidism- r/t Graves Disease -She is s/p RAI ablation for the second time on 01/15/21.    Her previsit thyroid function tests show adequate response to RAI ablation, now at a level where thyroid hormone replacement can be initiated.  She is advised to start Levothyroxine 50 mcg po daily before breakfast.  Will repeat thyroid function tests prior to next visit in 8 weeks and adjust dose if necessary.   - The correct intake of thyroid hormone (Levothyroxine, Synthroid), is on empty stomach first thing in the morning, with water, separated by at least 30 minutes from breakfast and other medications,  and separated by more than 4 hours from calcium, iron, multivitamins, acid reflux medications (PPIs).  - This medication is a life-long medication and will be needed to correct thyroid hormone imbalances for the rest of your life.  The dose may change from time to time, based on thyroid blood work.  - It is extremely important to be consistent taking this medication, near the same time each morning.  -AVOID TAKING PRODUCTS CONTAINING BIOTIN (commonly found in Hair, Skin, Nails vitamins) AS IT INTERFERES WITH THE VALIDITY OF THYROID FUNCTION BLOOD TESTS.     -Patient is advised to maintain close follow up with Kirstie Peri, MD for primary care needs.    I spent 20 minutes in the care of the patient today including review of labs from  Thyroid Function, CMP, and other relevant labs ; imaging/biopsy records (current and previous including abstractions from other facilities); face-to-face time discussing  her lab results and symptoms, medications doses, her options of short and long term treatment based on the latest standards of care / guidelines;   and documenting the encounter.  Zoe Webb  participated in the discussions, expressed understanding, and voiced agreement with the above plans.  All questions were answered to her satisfaction. she is encouraged to contact clinic should she have any  questions or concerns prior to her return visit.    Follow up plan: Return in about 8 weeks (around 09/02/2021) for Thyroid follow up, Previsit labs.     Ronny Bacon, [facility] [address] [place], [place] [address] Phone: [phone] Fax: [phone]   07/08/2021, 10:40 AM

## 2021-07-08 NOTE — Patient Instructions (Signed)

## 2021-07-22 ENCOUNTER — Ambulatory Visit: Payer: Medicare Other | Admitting: Nurse Practitioner

## 2021-07-30 DIAGNOSIS — E039 Hypothyroidism, unspecified: Secondary | ICD-10-CM | POA: Diagnosis not present

## 2021-07-30 DIAGNOSIS — M8580 Other specified disorders of bone density and structure, unspecified site: Secondary | ICD-10-CM | POA: Diagnosis not present

## 2021-07-30 DIAGNOSIS — F17210 Nicotine dependence, cigarettes, uncomplicated: Secondary | ICD-10-CM | POA: Diagnosis not present

## 2021-07-30 DIAGNOSIS — Z299 Encounter for prophylactic measures, unspecified: Secondary | ICD-10-CM | POA: Diagnosis not present

## 2021-07-30 DIAGNOSIS — I10 Essential (primary) hypertension: Secondary | ICD-10-CM | POA: Diagnosis not present

## 2021-08-18 ENCOUNTER — Other Ambulatory Visit: Payer: Self-pay | Admitting: Internal Medicine

## 2021-08-18 DIAGNOSIS — Z1231 Encounter for screening mammogram for malignant neoplasm of breast: Secondary | ICD-10-CM

## 2021-08-24 DIAGNOSIS — E0590 Thyrotoxicosis, unspecified without thyrotoxic crisis or storm: Secondary | ICD-10-CM | POA: Diagnosis not present

## 2021-08-25 LAB — TSH: TSH: 3.52 u[IU]/mL (ref 0.450–4.500)

## 2021-08-25 LAB — T4, FREE: Free T4: 1.2 ng/dL (ref 0.82–1.77)

## 2021-08-25 LAB — T3, FREE: T3, Free: 3.1 pg/mL (ref 2.0–4.4)

## 2021-09-02 ENCOUNTER — Encounter: Payer: Self-pay | Admitting: Nurse Practitioner

## 2021-09-02 ENCOUNTER — Ambulatory Visit: Payer: Medicare Other | Admitting: Nurse Practitioner

## 2021-09-02 VITALS — BP 128/78 | HR 92 | Ht 65.0 in | Wt 204.0 lb

## 2021-09-02 DIAGNOSIS — E890 Postprocedural hypothyroidism: Secondary | ICD-10-CM

## 2021-09-02 MED ORDER — LEVOTHYROXINE SODIUM 75 MCG PO TABS: 75.0000 ug | ORAL_TABLET | Freq: Every day | ORAL | 1 refills | Status: DC

## 2021-09-02 NOTE — Progress Notes (Signed)
09/02/2021     Endocrinology Follow Up Visit    Subjective:    Patient ID: Zoe Webb, female    DOB: 08-19-1950, PCP Kirstie Peri, MD.   Past Medical History:  Diagnosis Date   Hypertension    Osteopenia    Vitamin D deficiency     Past Surgical History:  Procedure Laterality Date   CYST REMOVAL NECK  74-75   TUBAL LIGATION  1979    Social History   Socioeconomic History   Marital status: Widowed    Spouse name: Not on file   Number of children: Not on file   Years of education: Not on file   Highest education level: Not on file  Occupational History   Not on file  Tobacco Use   Smoking status: Never   Smokeless tobacco: Never  Vaping Use   Vaping Use: Never used  Substance and Sexual Activity   Alcohol use: Not Currently   Drug use: Never   Sexual activity: Not Currently  Other Topics Concern   Not on file  Social History Narrative   Not on file   Social Determinants of Health   Financial Resource Strain: Not on file  Food Insecurity: Not on file  Transportation Needs: Not on file  Physical Activity: Not on file  Stress: Not on file  Social Connections: Not on file    Family History  Problem Relation Age of Onset   Cancer Father    Thyroid disease Sister     Outpatient Encounter Medications as of 09/02/2021  Medication Sig   alendronate (FOSAMAX) 70 MG tablet Take 70 mg by mouth once a week.   levothyroxine (SYNTHROID) 75 MCG tablet Take 1 tablet (75 mcg total) by mouth daily before breakfast.   lisinopril-hydrochlorothiazide (ZESTORETIC) 20-12.5 MG tablet Take 1 tablet by mouth daily.   Vitamin D, Ergocalciferol, (DRISDOL) 1.25 MG (50000 UNIT) CAPS capsule Take 50,000 Units by mouth once a week.   [DISCONTINUED] levothyroxine (SYNTHROID) 50 MCG tablet Take 1 tablet (50 mcg total) by mouth daily before breakfast.   No facility-administered encounter medications on file as of 09/02/2021.    ALLERGIES: No Known  Allergies  VACCINATION STATUS:  There is no immunization history on file for this patient.   HPI  JAZZMYN Webb is 71 y.o. female who presents today with a medical history as above. she is being seen in follow up after being seen in consultation for hyperthyroidism as requested by Kirstie Peri, MD.  she has been dealing with symptoms of fatigue, heat intolerance, weight gain, and alternating constipation/diarrhea for approximately 1 year.   Her recent thyroid function tests were reviewed.  she denies dysphagia, choking, shortness of breath, no recent voice change. She has no new complaints at this time and reports some improvements in some of her symptoms without intervention.   she does have a family history of thyroid dysfunction in her sister (hypothyroidism), but denies family hx of thyroid cancer. she denies personal history of goiter. she is not on any anti-thyroid medications nor on any thyroid hormone supplements at this time.  She had previously taken levothyroxine, prescribed by her PCP, but was taken off due to fluctuations in her thyroid labs.  Review of systems  Constitutional: + weight gain,  current Body mass index is 33.95 kg/m. , + fatigue, no subjective hyperthermia, no subjective hypothermia Eyes: no blurry vision, no xerophthalmia ENT: no sore throat, no nodules palpated in throat, no dysphagia/odynophagia, no hoarseness  Cardiovascular: no chest pain, no shortness of breath, no palpitations, no leg swelling Respiratory: no cough, no shortness of breath Gastrointestinal: no nausea/vomiting/diarrhea Musculoskeletal: no muscle/joint aches Skin: no rashes, no hyperemia Neurological: no tremors, no numbness, no tingling, no dizziness Psychiatric: + depression-situational, no anxiety   Objective:    BP 128/78   Pulse 92   Ht 5\' 5"  (1.651 m)   Wt 204 lb (92.5 kg)   BMI 33.95 kg/m   Wt Readings from Last 3 Encounters:  09/02/21 204 lb (92.5 kg)  07/08/21 202 lb  (91.6 kg)  05/20/21 199 lb 6.4 oz (90.4 kg)    BP Readings from Last 3 Encounters:  09/02/21 128/78  07/08/21 128/76  05/20/21 105/69     Physical Exam- Limited  Constitutional:  Body mass index is 33.95 kg/m. , not in acute distress, normal state of mind Eyes:  EOMI, no exophthalmos Neck: Supple Cardiovascular: RRR, no murmurs, rubs, or gallops, no edema Respiratory: Adequate breathing efforts, no crackles, rales, rhonchi, or wheezing Musculoskeletal: no gross deformities, strength intact in all four extremities, no gross restriction of joint movements Skin:  no rashes, no hyperemia Neurological: no tremor with outstretched hands   CMP  No results found for: "NA", "K", "CL", "CO2", "GLUCOSE", "BUN", "CREATININE", "CALCIUM", "PROT", "ALBUMIN", "AST", "ALT", "ALKPHOS", "BILITOT", "GFRNONAA", "GFRAA"   CBC No results found for: "WBC", "RBC", "HGB", "HCT", "PLT", "MCV", "MCH", "MCHC", "RDW", "LYMPHSABS", "MONOABS", "EOSABS", "BASOSABS"   Diabetic Labs (most recent): No results found for: "HGBA1C", "MICROALBUR"  Lipid Panel  No results found for: "CHOL", "TRIG", "HDL", "CHOLHDL", "VLDL", "LDLCALC", "LDLDIRECT", "LABVLDL"   Lab Results  Component Value Date   TSH 3.520 08/24/2021   TSH 7.110 (H) 06/29/2021   TSH 4.400 05/13/2021   TSH 2.410 03/18/2021   TSH 0.058 (L) 11/30/2020   TSH 1.390 08/18/2020   TSH 0.821 05/27/2020   TSH 0.035 (L) 02/26/2020   TSH 0.062 (L) 10/03/2019   FREET4 1.20 08/24/2021   FREET4 0.91 06/29/2021   FREET4 1.08 05/13/2021   FREET4 1.14 03/18/2021   FREET4 1.51 11/30/2020   FREET4 1.03 08/18/2020   FREET4 1.39 05/27/2020   FREET4 1.52 02/26/2020   FREET4 1.35 10/03/2019      Thyroid ultrasound performed on 08/12/2019 at [facility]:  Right lobe measuring 4.6 x 0.9 x 2.0 cm Left lobe measuring 6.6 x 2.9 x 2.8 cm Estimated total number of nodules greater than or equal to 1 cm: 3  Nodule 1: Location is in the left mid lobe measuring  3.4 x 2.0 x 2.4 cm Composition is solid/almost completely solid Echogenicity is isoechoic Ill-defined margins ACR TI-RADS risk category 3 Given size and appearance, fine-needle aspiration of this mildly suspicious nodule should be considered based on TI-RADS criteria.  Nodule 2: Location is left superior lobe measuring 1.8 x 1 x 1.4 cm Composition is solid/almost completely solid Echogenicity is hypoechoic Ill-defined margins ACR TI-RADS risk category 4-6 Given size and appearance fine-needle aspiration of this moderately suspicious nodule should be considered based on TI-RADS criteria  Nodule 3: Location is left inferior lobe measuring 1.3 x 1 x 0.8 cm Composition is solid/almost completely solid Echogenicity is hypoechoic Ill-defined margins ACR TI-RADS risk category 4-6 Given size and appearance, a follow-up ultrasound in 1 year should be considered based on TI-RADS criteria --------------------------------------------------------------------------------------------------------------  Pathology Report: 10/10/19 NODULE 1: Clinical History: 3.4 cm LML  Specimen Submitted:  A. THYROID, LEFT MID, FINE NEEDLE ASPIRATION:   FINAL MICROSCOPIC DIAGNOSIS:  - Consistent with  benign follicular nodule (Bethesda category II)   SPECIMEN ADEQUACY:  Satisfactory for evaluation   GROSS:  Received is/are 3 slides in 95% Ethyl alcohol, 3 air dried slides for  diff stain, and 30 ccs of pale pink Cytolyt solution. (CM:cm)  Prepared:  Smears:  6  Concentration Method (Thin Prep):  1  Cell Block:  Cell block attempted, not obtained.  Additional Studies:  Also there was an Afirma collected.  NODULE 2: Clinical History: 1.8 cm LUL  Specimen Submitted:  A. THYROID, LEFT SUPERIOR, FINE NEEDLE ASPIRATION:    FINAL MICROSCOPIC DIAGNOSIS:  - Consistent with benign follicular nodule (Bethesda category II)   SPECIMEN ADEQUACY:  Satisfactory for evaluation   GROSS:  Received is/are 3 slides in  95% Ethyl alcohol, 3 air dried slides for  diff stain, and 30 ccs of clear colorless Cytolyt solution. (CM:cm)  Prepared:  Smears:  6  Concentration Method (Thin Prep):  1  Cell Block:  Cell block attempted, not obtained.  Additional Studies:  Also there was an Afirma collected.  ------------------------------------------------------------------------------------------------- Uptake and Scan results from 10/29/19 EXAM: THYROID SCAN AND UPTAKE - 4 AND 24 HOURS   TECHNIQUE: Following oral administration of I-123 capsule, anterior planar imaging was acquired at 24 hours. Thyroid uptake was calculated with a thyroid probe at 4-6 hours and 24 hours.   RADIOPHARMACEUTICALS:  460.0 uCi I-123 sodium iodide p.o.   COMPARISON:  Thyroid ultrasound 08/12/2019   FINDINGS: Minimal thyroid heterogeneity without focal hot or cold nodules.   4 hour I-123 uptake = 14.1% (normal 5-20%)   24 hour I-123 uptake = 26.0% (normal 10-30%)   IMPRESSION: 1. Normal radioactive iodine uptake at 4 and 24 hours. 2. Minimally heterogeneous appearance of the gland without focal hot or cold nodules. 3. Based on prior ultrasound appearance, findings could reflect subacute thyroiditis or mild/early toxic multinodular goiter.    ------------------------------------------------------------------------------------------------------------------------- Thyroid US from 10/21/20 CLINICAL DATA:  Subclinical hyperthyroidism, multinodular goiter. Previous FNA biopsy of mid left and superior left nodules 10/10/2019.   EXAM: THYROID ULTRASOUND   TECHNIQUE: Ultrasound examination of the thyroid gland and adjacent soft tissues was performed.   COMPARISON:  08/12/2019   FINDINGS: Parenchymal Echotexture: Markedly heterogenous   Isthmus: 0.2 cm thickness, previously 0.3   Right lobe: 4.7 x 2.2 x 1.9 cm, previously 4.6 x 1.7 x 2   Left lobe: 5.7 x 2.5 x 2.8 cm, previously 6 x 6 x 2.9 x 2.8    _________________________________________________________   Estimated total number of nodules >/= 1 cm: 3   Number of spongiform nodules >/=  2 cm not described below (TR1): 0   Number of mixed cystic and solid nodules >/= 1.5 cm not described below (TR2): 0   _________________________________________________________   Nodule 1: 1.4 x 1.3 x 0.9 cm superior left, previously 1.8 x 1.4 x 1. This was previously biopsied.   Nodule 2: 3.1 x 2.3 x 2.1 cm mid left, previously 3.4 x 2.4 x 2.0; this was previously biopsied.   Nodule 3: 1.2 cm benign colloid cyst, inferior left, with no internal flow signal, previously 1.3. This nodule does NOT meet TI-RADS criteria for biopsy or dedicated follow-up.   IMPRESSION: 1. Heterogenous thyroid with stable left nodules. None currently meets criteria for biopsy or follow-up.   The above is in keeping with the ACR TI-RADS recommendations - J Am Coll Radiol 2017;14:587-595.     Electronically Signed   By: Corlis Leak  Hassell M.D.   On: 10/21/2020 12:57  Repeat Uptake and scan from 12/18/20 CLINICAL DATA:  Suppressed TSH, irritability for 1 year, TSH 0.058, question multinodular goiter   EXAM: THYROID SCAN AND UPTAKE - 4 AND 24 HOURS   TECHNIQUE: Following oral administration of I-123 capsule, anterior planar imaging was acquired at 24 hours. Thyroid uptake was calculated with a thyroid probe at 4-6 hours and 24 hours.   RADIOPHARMACEUTICALS:  424 uCi I-123 sodium iodide p.o.   COMPARISON:  Thyroid ultrasound 08/12/2019   FINDINGS: Asymmetric thyroid lobes LEFT larger than RIGHT.   Homogeneous tracer distribution in both thyroid lobes.   Tiny RIGHT upper lobe cold nodule.   No other focal areas of increased or decreased tracer uptake.   4 hour I-123 uptake = 18.9% (normal 5-20%)   24 hour I-123 uptake = 32.9% (normal 10-30%)   IMPRESSION: Homogeneous uptake in the thyroid lobes bilaterally with a single subtle cold nodule at  the lateral aspect of the RIGHT lobe.   Elevated 24 hour radio iodine uptake consistent with hyperthyroidism.   Overall findings are most consistent with Graves disease with a single tiny nonspecific RIGHT thyroid nodule.     Electronically Signed   By: Ulyses Southward M.D.   On: 12/18/2020 15:35   Latest Reference Range & Units 03/18/21 08:30 05/13/21 10:40 06/29/21 08:56 08/24/21 08:35  TSH 0.450 - 4.500 uIU/mL 2.410 4.400 7.110 (H) 3.520  Triiodothyronine,Free,Serum 2.0 - 4.4 pg/mL 3.0 2.9 3.0 3.1  T4,Free(Direct) 0.82 - 1.77 ng/dL 9.62 9.52 8.41 3.24  (H): Data is abnormally high Assessment & Plan:   1. Multinodular goiter The biopsy of the two suspicious nodules show benign follicular nodules.  Her repeat thyroid ultrasound shows stable nodules, not requiring additional biopsy or dedicated follow up.    2. Hyperthyroidism- r/t Graves Disease -She is s/p RAI ablation for the second time on 01/15/21.    Her previsit thyroid function tests are consistent with slight under-replacement.  She is advised to increase her dose of Levothyroxine to 75 mcg po daily before breakfast.  Will recheck TFTs prior to next visit and adjust dose further if needed.   - The correct intake of thyroid hormone (Levothyroxine, Synthroid), is on empty stomach first thing in the morning, with water, separated by at least 30 minutes from breakfast and other medications,  and separated by more than 4 hours from calcium, iron, multivitamins, acid reflux medications (PPIs).  - This medication is a life-long medication and will be needed to correct thyroid hormone imbalances for the rest of your life.  The dose may change from time to time, based on thyroid blood work.  - It is extremely important to be consistent taking this medication, near the same time each morning.  -AVOID TAKING PRODUCTS CONTAINING BIOTIN (commonly found in Hair, Skin, Nails vitamins) AS IT INTERFERES WITH THE VALIDITY OF THYROID FUNCTION  BLOOD TESTS.     -Patient is advised to maintain close follow up with Kirstie Peri, MD for primary care needs.    I spent 20 minutes in the care of the patient today including review of labs from Thyroid Function, CMP, and other relevant labs ; imaging/biopsy records (current and previous including abstractions from other facilities); face-to-face time discussing  her lab results and symptoms, medications doses, her options of short and long term treatment based on the latest standards of care / guidelines;   and documenting the encounter.  Zoe Webb  participated in the discussions, expressed understanding, and voiced agreement with the above plans.  All questions were answered to her satisfaction. she is encouraged to contact clinic should she have any questions or concerns prior to her return visit.    Follow up plan: Return in about 2 months (around 11/03/2021) for Thyroid follow up, Previsit labs.     Ronny Bacon, [facility] [address] [place], [place] [address] Phone: [phone] Fax: [phone]   09/02/2021, 10:37 AM

## 2021-09-02 NOTE — Patient Instructions (Signed)

## 2021-09-15 ENCOUNTER — Ambulatory Visit
Admission: RE | Admit: 2021-09-15 | Discharge: 2021-09-15 | Disposition: A | Discharge status: Home / Self Care | Payer: Medicare Other | Source: Ambulatory Visit | Attending: Internal Medicine | Admitting: Internal Medicine

## 2021-09-15 DIAGNOSIS — Z1231 Encounter for screening mammogram for malignant neoplasm of breast: Secondary | ICD-10-CM

## 2021-10-27 DIAGNOSIS — E890 Postprocedural hypothyroidism: Secondary | ICD-10-CM | POA: Diagnosis not present

## 2021-10-28 LAB — T4, FREE: Free T4: 1.44 ng/dL (ref 0.82–1.77)

## 2021-10-28 LAB — TSH: TSH: 1.8 u[IU]/mL (ref 0.450–4.500)

## 2021-11-03 ENCOUNTER — Encounter: Payer: Self-pay | Admitting: Nurse Practitioner

## 2021-11-03 ENCOUNTER — Ambulatory Visit: Payer: Medicare Other | Admitting: Nurse Practitioner

## 2021-11-03 VITALS — BP 132/81 | HR 64 | Ht 65.0 in | Wt 204.4 lb

## 2021-11-03 DIAGNOSIS — E042 Nontoxic multinodular goiter: Secondary | ICD-10-CM | POA: Diagnosis not present

## 2021-11-03 DIAGNOSIS — E890 Postprocedural hypothyroidism: Secondary | ICD-10-CM

## 2021-11-03 MED ORDER — LEVOTHYROXINE SODIUM 75 MCG PO TABS
75.0000 ug | ORAL_TABLET | Freq: Every day | ORAL | 1 refills | Status: DC
Start: 2021-11-03 — End: 2022-03-07

## 2021-11-03 NOTE — Progress Notes (Signed)
11/03/2021     Endocrinology Follow Up Visit    Subjective:    Patient ID: Zoe Webb, female    DOB: 07-Oct-1950, PCP Kirstie Peri, MD.   Past Medical History:  Diagnosis Date   Hypertension    Osteopenia    Vitamin D deficiency     Past Surgical History:  Procedure Laterality Date   CYST REMOVAL NECK  74-75   TUBAL LIGATION  1979    Social History   Socioeconomic History   Marital status: Widowed    Spouse name: Not on file   Number of children: Not on file   Years of education: Not on file   Highest education level: Not on file  Occupational History   Not on file  Tobacco Use   Smoking status: Never   Smokeless tobacco: Never  Vaping Use   Vaping Use: Never used  Substance and Sexual Activity   Alcohol use: Not Currently   Drug use: Never   Sexual activity: Not Currently  Other Topics Concern   Not on file  Social History Narrative   Not on file   Social Determinants of Health   Financial Resource Strain: Not on file  Food Insecurity: Not on file  Transportation Needs: Not on file  Physical Activity: Not on file  Stress: Not on file  Social Connections: Not on file    Family History  Problem Relation Age of Onset   Cancer Father    Thyroid disease Sister    Breast cancer Neg Hx     Outpatient Encounter Medications as of 11/03/2021  Medication Sig   alendronate (FOSAMAX) 70 MG tablet Take 70 mg by mouth once a week.   lisinopril-hydrochlorothiazide (ZESTORETIC) 20-12.5 MG tablet Take 1 tablet by mouth daily.   Vitamin D, Ergocalciferol, (DRISDOL) 1.25 MG (50000 UNIT) CAPS capsule Take 50,000 Units by mouth once a week.   [DISCONTINUED] levothyroxine (SYNTHROID) 75 MCG tablet Take 1 tablet (75 mcg total) by mouth daily before breakfast.   levothyroxine (SYNTHROID) 75 MCG tablet Take 1 tablet (75 mcg total) by mouth daily before breakfast.   No facility-administered encounter medications on file as of 11/03/2021.     ALLERGIES: No Known Allergies  VACCINATION STATUS:  There is no immunization history on file for this patient.   HPI  Zoe Webb is 71 y.o. female who presents today with a medical history as above. she is being seen in follow up after being seen in consultation for hyperthyroidism as requested by Kirstie Peri, MD.  she has been dealing with symptoms of fatigue, heat intolerance, weight gain, and alternating constipation/diarrhea for approximately 1 year.   Her recent thyroid function tests were reviewed.  she denies dysphagia, choking, shortness of breath, no recent voice change. She has no new complaints at this time and reports some improvements in some of her symptoms without intervention.   she does have a family history of thyroid dysfunction in her sister (hypothyroidism), but denies family hx of thyroid cancer. she denies personal history of goiter. she is not on any anti-thyroid medications nor on any thyroid hormone supplements at this time.  She had previously taken levothyroxine, prescribed by her PCP, but was taken off due to fluctuations in her thyroid labs.  Review of systems  Constitutional: + Minimally fluctuating body weight,  current Body mass index is 34.01 kg/m. , no fatigue, no subjective hyperthermia, + subjective hypothermia Eyes: no blurry vision, no xerophthalmia ENT: no sore throat,  no nodules palpated in throat, no dysphagia/odynophagia, no hoarseness Cardiovascular: no chest pain, no shortness of breath, no palpitations, no leg swelling Respiratory: no cough, no shortness of breath Gastrointestinal: no nausea/vomiting/diarrhea Musculoskeletal: no muscle/joint aches Skin: no rashes, no hyperemia Neurological: no tremors, no numbness, no tingling, no dizziness Psychiatric: no depression, no anxiety   Objective:    BP 132/81 (BP Location: Right Arm, Patient Position: Sitting, Cuff Size: Large)   Pulse 64   Ht 5\' 5"  (1.651 m)   Wt 204 lb 6.4 oz  (92.7 kg)   BMI 34.01 kg/m   Wt Readings from Last 3 Encounters:  11/03/21 204 lb 6.4 oz (92.7 kg)  09/02/21 204 lb (92.5 kg)  07/08/21 202 lb (91.6 kg)    BP Readings from Last 3 Encounters:  11/03/21 132/81  09/02/21 128/78  07/08/21 128/76     Physical Exam- Limited  Constitutional:  Body mass index is 34.01 kg/m. , not in acute distress, normal state of mind Eyes:  EOMI, no exophthalmos Neck: Supple Cardiovascular: RRR, no murmurs, rubs, or gallops, no edema Respiratory: Adequate breathing efforts, no crackles, rales, rhonchi, or wheezing Musculoskeletal: no gross deformities, strength intact in all four extremities, no gross restriction of joint movements Skin:  no rashes, no hyperemia Neurological: no tremor with outstretched hands   CMP  No results found for: "NA", "K", "CL", "CO2", "GLUCOSE", "BUN", "CREATININE", "CALCIUM", "PROT", "ALBUMIN", "AST", "ALT", "ALKPHOS", "BILITOT", "GFRNONAA", "GFRAA"   CBC No results found for: "WBC", "RBC", "HGB", "HCT", "PLT", "MCV", "MCH", "MCHC", "RDW", "LYMPHSABS", "MONOABS", "EOSABS", "BASOSABS"   Diabetic Labs (most recent): No results found for: "HGBA1C", "MICROALBUR"  Lipid Panel  No results found for: "CHOL", "TRIG", "HDL", "CHOLHDL", "VLDL", "LDLCALC", "LDLDIRECT", "LABVLDL"   Lab Results  Component Value Date   TSH 1.800 10/27/2021   TSH 3.520 08/24/2021   TSH 7.110 (H) 06/29/2021   TSH 4.400 05/13/2021   TSH 2.410 03/18/2021   TSH 0.058 (L) 11/30/2020   TSH 1.390 08/18/2020   TSH 0.821 05/27/2020   TSH 0.035 (L) 02/26/2020   TSH 0.062 (L) 10/03/2019   FREET4 1.44 10/27/2021   FREET4 1.20 08/24/2021   FREET4 0.91 06/29/2021   FREET4 1.08 05/13/2021   FREET4 1.14 03/18/2021   FREET4 1.51 11/30/2020   FREET4 1.03 08/18/2020   FREET4 1.39 05/27/2020   FREET4 1.52 02/26/2020   FREET4 1.35 10/03/2019      Thyroid ultrasound performed on 08/12/2019 at [facility]:  Right lobe measuring 4.6 x 0.9 x 2.0  cm Left lobe measuring 6.6 x 2.9 x 2.8 cm Estimated total number of nodules greater than or equal to 1 cm: 3  Nodule 1: Location is in the left mid lobe measuring 3.4 x 2.0 x 2.4 cm Composition is solid/almost completely solid Echogenicity is isoechoic Ill-defined margins ACR TI-RADS risk category 3 Given size and appearance, fine-needle aspiration of this mildly suspicious nodule should be considered based on TI-RADS criteria.  Nodule 2: Location is left superior lobe measuring 1.8 x 1 x 1.4 cm Composition is solid/almost completely solid Echogenicity is hypoechoic Ill-defined margins ACR TI-RADS risk category 4-6 Given size and appearance fine-needle aspiration of this moderately suspicious nodule should be considered based on TI-RADS criteria  Nodule 3: Location is left inferior lobe measuring 1.3 x 1 x 0.8 cm Composition is solid/almost completely solid Echogenicity is hypoechoic Ill-defined margins ACR TI-RADS risk category 4-6 Given size and appearance, a follow-up ultrasound in 1 year should be considered based on TI-RADS criteria --------------------------------------------------------------------------------------------------------------  Pathology Report: 10/10/19 NODULE 1: Clinical History: 3.4 cm LML  Specimen Submitted:  A. THYROID, LEFT MID, FINE NEEDLE ASPIRATION:   FINAL MICROSCOPIC DIAGNOSIS:  - Consistent with benign follicular nodule (Bethesda category II)   SPECIMEN ADEQUACY:  Satisfactory for evaluation   GROSS:  Received is/are 3 slides in 95% Ethyl alcohol, 3 air dried slides for  diff stain, and 30 ccs of pale pink Cytolyt solution. (CM:cm)  Prepared:  Smears:  6  Concentration Method (Thin Prep):  1  Cell Block:  Cell block attempted, not obtained.  Additional Studies:  Also there was an Afirma collected.  NODULE 2: Clinical History: 1.8 cm LUL  Specimen Submitted:  A. THYROID, LEFT SUPERIOR, FINE NEEDLE ASPIRATION:    FINAL MICROSCOPIC  DIAGNOSIS:  - Consistent with benign follicular nodule (Bethesda category II)   SPECIMEN ADEQUACY:  Satisfactory for evaluation   GROSS:  Received is/are 3 slides in 95% Ethyl alcohol, 3 air dried slides for  diff stain, and 30 ccs of clear colorless Cytolyt solution. (CM:cm)  Prepared:  Smears:  6  Concentration Method (Thin Prep):  1  Cell Block:  Cell block attempted, not obtained.  Additional Studies:  Also there was an Afirma collected.  ------------------------------------------------------------------------------------------------- Uptake and Scan results from 10/29/19 EXAM: THYROID SCAN AND UPTAKE - 4 AND 24 HOURS   TECHNIQUE: Following oral administration of I-123 capsule, anterior planar imaging was acquired at 24 hours. Thyroid uptake was calculated with a thyroid probe at 4-6 hours and 24 hours.   RADIOPHARMACEUTICALS:  460.0 uCi I-123 sodium iodide p.o.   COMPARISON:  Thyroid ultrasound 08/12/2019   FINDINGS: Minimal thyroid heterogeneity without focal hot or cold nodules.   4 hour I-123 uptake = 14.1% (normal 5-20%)   24 hour I-123 uptake = 26.0% (normal 10-30%)   IMPRESSION: 1. Normal radioactive iodine uptake at 4 and 24 hours. 2. Minimally heterogeneous appearance of the gland without focal hot or cold nodules. 3. Based on prior ultrasound appearance, findings could reflect subacute thyroiditis or mild/early toxic multinodular goiter.    ------------------------------------------------------------------------------------------------------------------------- Thyroid US from 10/21/20 CLINICAL DATA:  Subclinical hyperthyroidism, multinodular goiter. Previous FNA biopsy of mid left and superior left nodules 10/10/2019.   EXAM: THYROID ULTRASOUND   TECHNIQUE: Ultrasound examination of the thyroid gland and adjacent soft tissues was performed.   COMPARISON:  08/12/2019   FINDINGS: Parenchymal Echotexture: Markedly heterogenous   Isthmus: 0.2 cm  thickness, previously 0.3   Right lobe: 4.7 x 2.2 x 1.9 cm, previously 4.6 x 1.7 x 2   Left lobe: 5.7 x 2.5 x 2.8 cm, previously 6 x 6 x 2.9 x 2.8   _________________________________________________________   Estimated total number of nodules >/= 1 cm: 3   Number of spongiform nodules >/=  2 cm not described below (TR1): 0   Number of mixed cystic and solid nodules >/= 1.5 cm not described below (TR2): 0   _________________________________________________________   Nodule 1: 1.4 x 1.3 x 0.9 cm superior left, previously 1.8 x 1.4 x 1. This was previously biopsied.   Nodule 2: 3.1 x 2.3 x 2.1 cm mid left, previously 3.4 x 2.4 x 2.0; this was previously biopsied.   Nodule 3: 1.2 cm benign colloid cyst, inferior left, with no internal flow signal, previously 1.3. This nodule does NOT meet TI-RADS criteria for biopsy or dedicated follow-up.   IMPRESSION: 1. Heterogenous thyroid with stable left nodules. None currently meets criteria for biopsy or follow-up.   The above is in keeping with  the ACR TI-RADS recommendations - J Am Coll Radiol 2017;14:587-595.     Electronically Signed   By: Corlis Leak  Hassell M.D.   On: 10/21/2020 12:57    Repeat Uptake and scan from 12/18/20 CLINICAL DATA:  Suppressed TSH, irritability for 1 year, TSH 0.058, question multinodular goiter   EXAM: THYROID SCAN AND UPTAKE - 4 AND 24 HOURS   TECHNIQUE: Following oral administration of I-123 capsule, anterior planar imaging was acquired at 24 hours. Thyroid uptake was calculated with a thyroid probe at 4-6 hours and 24 hours.   RADIOPHARMACEUTICALS:  424 uCi I-123 sodium iodide p.o.   COMPARISON:  Thyroid ultrasound 08/12/2019   FINDINGS: Asymmetric thyroid lobes LEFT larger than RIGHT.   Homogeneous tracer distribution in both thyroid lobes.   Tiny RIGHT upper lobe cold nodule.   No other focal areas of increased or decreased tracer uptake.   4 hour I-123 uptake = 18.9% (normal 5-20%)    24 hour I-123 uptake = 32.9% (normal 10-30%)   IMPRESSION: Homogeneous uptake in the thyroid lobes bilaterally with a single subtle cold nodule at the lateral aspect of the RIGHT lobe.   Elevated 24 hour radio iodine uptake consistent with hyperthyroidism.   Overall findings are most consistent with Graves disease with a single tiny nonspecific RIGHT thyroid nodule.     Electronically Signed   By: Ulyses SouthwardMark  Boles M.D.   On: 12/18/2020 15:35    Latest Reference Range & Units 03/18/21 08:30 05/13/21 10:40 06/29/21 08:56 08/24/21 08:35 10/27/21 08:35  TSH 0.450 - 4.500 uIU/mL 2.410 4.400 7.110 (H) 3.520 1.800  Triiodothyronine,Free,Serum 2.0 - 4.4 pg/mL 3.0 2.9 3.0 3.1   T4,Free(Direct) 0.82 - 1.77 ng/dL 1.611.14 0.961.08 0.450.91 4.091.20 8.111.44  (H): Data is abnormally high  Assessment & Plan:   1. Multinodular goiter The biopsy of the two suspicious nodules show benign follicular nodules.  Her repeat thyroid ultrasound shows stable nodules, not requiring additional biopsy or dedicated follow up.    2. Hyperthyroidism- r/t Graves Disease -She is s/p RAI ablation for the second time on 01/15/21.    Her previsit thyroid function tests are consistent with appropriate hormone replacement.  She is advised to continue Levothyroxine 75 mcg po daily before breakfast.    - The correct intake of thyroid hormone (Levothyroxine, Synthroid), is on empty stomach first thing in the morning, with water, separated by at least 30 minutes from breakfast and other medications,  and separated by more than 4 hours from calcium, iron, multivitamins, acid reflux medications (PPIs).  - This medication is a life-long medication and will be needed to correct thyroid hormone imbalances for the rest of your life.  The dose may change from time to time, based on thyroid blood work.  - It is extremely important to be consistent taking this medication, near the same time each morning.  -AVOID TAKING PRODUCTS CONTAINING BIOTIN  (commonly found in Hair, Skin, Nails vitamins) AS IT INTERFERES WITH THE VALIDITY OF THYROID FUNCTION BLOOD TESTS.     -Patient is advised to maintain close follow up with Kirstie PeriShah, Ashish, MD for primary care needs.     I spent 18 minutes in the care of the patient today including review of labs from Thyroid Function, CMP, and other relevant labs ; imaging/biopsy records (current and previous including abstractions from other facilities); face-to-face time discussing  her lab results and symptoms, medications doses, her options of short and long term treatment based on the latest standards of care / guidelines;  and documenting the encounter.  Zoe Webb  participated in the discussions, expressed understanding, and voiced agreement with the above plans.  All questions were answered to her satisfaction. she is encouraged to contact clinic should she have any questions or concerns prior to her return visit.    Follow up plan: Return in about 4 months (around 03/05/2022) for Thyroid follow up, Previsit labs.     Rayetta Pigg, [facility] [address] [place], [place] [address] Phone: [phone] Fax: [phone]   11/03/2021, 10:27 AM

## 2021-11-03 NOTE — Patient Instructions (Signed)

## 2021-11-24 DIAGNOSIS — J209 Acute bronchitis, unspecified: Secondary | ICD-10-CM | POA: Diagnosis not present

## 2021-11-24 DIAGNOSIS — R030 Elevated blood-pressure reading, without diagnosis of hypertension: Secondary | ICD-10-CM | POA: Diagnosis not present

## 2021-12-20 DIAGNOSIS — H2513 Age-related nuclear cataract, bilateral: Secondary | ICD-10-CM | POA: Diagnosis not present

## 2021-12-20 DIAGNOSIS — E0500 Thyrotoxicosis with diffuse goiter without thyrotoxic crisis or storm: Secondary | ICD-10-CM | POA: Diagnosis not present

## 2021-12-20 DIAGNOSIS — H524 Presbyopia: Secondary | ICD-10-CM | POA: Diagnosis not present

## 2021-12-20 DIAGNOSIS — H25013 Cortical age-related cataract, bilateral: Secondary | ICD-10-CM | POA: Diagnosis not present

## 2022-01-26 DIAGNOSIS — Z1211 Encounter for screening for malignant neoplasm of colon: Secondary | ICD-10-CM | POA: Diagnosis not present

## 2022-02-06 IMAGING — NM NM RAI THERAPY FOR HYPERTHYROIDISM
1 series · 1 of 1 positions shown · non-contrast
Comparison: None.

CLINICAL DATA: Hyperthyroidism

EXAM:
RADIOACTIVE IODINE THERAPY FOR HYPERTHYROIDISM
TECHNIQUE: Radioactive iodine prescribed by Dr. Chupika. The risks and benefits
of radioactive iodine therapy were discussed with the patient in
detail by Zelnadia Lonia and Dr. Mabrak. Alternative therapies were
also mentioned. Radiation safety was discussed with the patient,
including how to protect the general public from exposure. There
were no barriers to communication. Written consent was obtained. The
patient then received a capsule containing the radiopharmaceutical.
The patient will follow-up with the referring physician.
RADIOPHARMACEUTICALS:  20 mCi N-ACA sodium iodide orally

[Series 1: bone statics · 2.07mm/px · 1 of 1 slices shown]
[im 1/1]
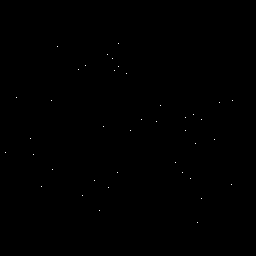

[1 of 1 positions shown; findings below may reference images not displayed]

IMPRESSION: Per oral administration of N-ACA sodium iodide for the treatment of
hyperthyroidism.

## 2022-03-01 DIAGNOSIS — E890 Postprocedural hypothyroidism: Secondary | ICD-10-CM | POA: Diagnosis not present

## 2022-03-02 LAB — T4, FREE: Free T4: 1.39 ng/dL (ref 0.82–1.77)

## 2022-03-02 LAB — TSH: TSH: 2.09 u[IU]/mL (ref 0.450–4.500)

## 2022-03-03 NOTE — Patient Instructions (Signed)

## 2022-03-07 ENCOUNTER — Encounter: Payer: Self-pay | Admitting: Nurse Practitioner

## 2022-03-07 ENCOUNTER — Ambulatory Visit: Payer: Medicare Other | Admitting: Nurse Practitioner

## 2022-03-07 VITALS — BP 119/74 | HR 82 | Ht 65.0 in | Wt 201.6 lb

## 2022-03-07 DIAGNOSIS — E890 Postprocedural hypothyroidism: Secondary | ICD-10-CM | POA: Diagnosis not present

## 2022-03-07 MED ORDER — LEVOTHYROXINE SODIUM 75 MCG PO TABS
75.0000 ug | ORAL_TABLET | Freq: Every day | ORAL | 1 refills | Status: DC
Start: 2022-03-07 — End: 2022-07-12

## 2022-03-07 NOTE — Progress Notes (Signed)
03/07/2022     Endocrinology Follow Up Visit    Subjective:    Patient ID: Zoe Webb, female    DOB: 1950/02/15, PCP Zoe Blitz, MD.   Past Medical History:  Diagnosis Date   Hypertension    Osteopenia    Vitamin D deficiency     Past Surgical History:  Procedure Laterality Date   CYST REMOVAL NECK  74-75   TUBAL LIGATION  1979    Social History   Socioeconomic History   Marital status: Widowed    Spouse name: Not on file   Number of children: Not on file   Years of education: Not on file   Highest education level: Not on file  Occupational History   Not on file  Tobacco Use   Smoking status: Never   Smokeless tobacco: Never  Vaping Use   Vaping Use: Never used  Substance and Sexual Activity   Alcohol use: Not Currently   Drug use: Never   Sexual activity: Not Currently  Other Topics Concern   Not on file  Social History Narrative   Not on file   Social Determinants of Health   Financial Resource Strain: Not on file  Food Insecurity: Not on file  Transportation Needs: Not on file  Physical Activity: Not on file  Stress: Not on file  Social Connections: Not on file    Family History  Problem Relation Age of Onset   Cancer Father    Thyroid disease Sister    Breast cancer Neg Hx     Outpatient Encounter Medications as of 03/07/2022  Medication Sig   alendronate (FOSAMAX) 70 MG tablet Take 70 mg by mouth once a week.   lisinopril-hydrochlorothiazide (ZESTORETIC) 20-12.5 MG tablet Take 1 tablet by mouth daily.   Vitamin D, Ergocalciferol, (DRISDOL) 1.25 MG (50000 UNIT) CAPS capsule Take 50,000 Units by mouth once a week.   [DISCONTINUED] levothyroxine (SYNTHROID) 75 MCG tablet Take 1 tablet (75 mcg total) by mouth daily before breakfast.   levothyroxine (SYNTHROID) 75 MCG tablet Take 1 tablet (75 mcg total) by mouth daily before breakfast.   No facility-administered encounter medications on file as of 03/07/2022.     ALLERGIES: No Known Allergies  VACCINATION STATUS:  There is no immunization history on file for this patient.   HPI  Zoe Webb is 72 y.o. female who presents today with a medical history as above. she is being seen in follow up after being seen in consultation for hyperthyroidism as requested by Zoe Blitz, MD.  she has been dealing with symptoms of fatigue, heat intolerance, weight gain, and alternating constipation/diarrhea for approximately 1 year.   Her recent thyroid function tests were reviewed.  she denies dysphagia, choking, shortness of breath, no recent voice change. She has no new complaints at this time and reports some improvements in some of her symptoms without intervention.   she does have a family history of thyroid dysfunction in her sister (hypothyroidism), but denies family hx of thyroid cancer. she denies personal history of goiter.   She is currently on Levothyroxine 75 mcg po daily for RAI induced hypothyroidism for toxic MNG.  Review of systems  Constitutional: + Minimally fluctuating body weight,  current Body mass index is 33.55 kg/m. , no fatigue, no subjective hyperthermia, + subjective hypothermia- improved Eyes: no blurry vision, no xerophthalmia ENT: no sore throat, no nodules palpated in throat, no dysphagia/odynophagia, no hoarseness Cardiovascular: no chest pain, no shortness of breath, no  palpitations, no leg swelling Respiratory: no cough, no shortness of breath Gastrointestinal: no nausea/vomiting/diarrhea Musculoskeletal: no muscle/joint aches Skin: no rashes, no hyperemia Neurological: no tremors, no numbness, no tingling, no dizziness Psychiatric: no depression, no anxiety   Objective:    BP 119/74 (BP Location: Left Arm, Patient Position: Sitting, Cuff Size: Normal)   Pulse 82   Ht 5\' 5"  (1.651 m)   Wt 201 lb 9.6 oz (91.4 kg)   BMI 33.55 kg/m   Wt Readings from Last 3 Encounters:  03/07/22 201 lb 9.6 oz (91.4 kg)   11/03/21 204 lb 6.4 oz (92.7 kg)  09/02/21 204 lb (92.5 kg)    BP Readings from Last 3 Encounters:  03/07/22 119/74  11/03/21 132/81  09/02/21 128/78     Physical Exam- Limited  Constitutional:  Body mass index is 33.55 kg/m. , not in acute distress, normal state of mind Eyes:  EOMI, no exophthalmos Musculoskeletal: no gross deformities, strength intact in all four extremities, no gross restriction of joint movements Skin:  no rashes, no hyperemia Neurological: no tremor with outstretched hands   CMP  No results found for: "NA", "K", "CL", "CO2", "GLUCOSE", "BUN", "CREATININE", "CALCIUM", "PROT", "ALBUMIN", "AST", "ALT", "ALKPHOS", "BILITOT", "GFRNONAA", "GFRAA"   CBC No results found for: "WBC", "RBC", "HGB", "HCT", "PLT", "MCV", "MCH", "MCHC", "RDW", "LYMPHSABS", "MONOABS", "EOSABS", "BASOSABS"   Diabetic Labs (most recent): No results found for: "HGBA1C", "MICROALBUR"  Lipid Panel  No results found for: "CHOL", "TRIG", "HDL", "CHOLHDL", "VLDL", "LDLCALC", "LDLDIRECT", "LABVLDL"   Lab Results  Component Value Date   TSH 2.090 03/01/2022   TSH 1.800 10/27/2021   TSH 3.520 08/24/2021   TSH 7.110 (H) 06/29/2021   TSH 4.400 05/13/2021   TSH 2.410 03/18/2021   TSH 0.058 (L) 11/30/2020   TSH 1.390 08/18/2020   TSH 0.821 05/27/2020   TSH 0.035 (L) 02/26/2020   FREET4 1.39 03/01/2022   FREET4 1.44 10/27/2021   FREET4 1.20 08/24/2021   FREET4 0.91 06/29/2021   FREET4 1.08 05/13/2021   FREET4 1.14 03/18/2021   FREET4 1.51 11/30/2020   FREET4 1.03 08/18/2020   FREET4 1.39 05/27/2020   FREET4 1.52 02/26/2020      Thyroid ultrasound performed on 08/12/2019 at [facility]:  Right lobe measuring 4.6 x 0.9 x 2.0 cm Left lobe measuring 6.6 x 2.9 x 2.8 cm Estimated total number of nodules greater than or equal to 1 cm: 3  Nodule 1: Location is in the left mid lobe measuring 3.4 x 2.0 x 2.4 cm Composition is solid/almost completely solid Echogenicity is  isoechoic Ill-defined margins ACR TI-RADS risk category 3 Given size and appearance, fine-needle aspiration of this mildly suspicious nodule should be considered based on TI-RADS criteria.  Nodule 2: Location is left superior lobe measuring 1.8 x 1 x 1.4 cm Composition is solid/almost completely solid Echogenicity is hypoechoic Ill-defined margins ACR TI-RADS risk category 4-6 Given size and appearance fine-needle aspiration of this moderately suspicious nodule should be considered based on TI-RADS criteria  Nodule 3: Location is left inferior lobe measuring 1.3 x 1 x 0.8 cm Composition is solid/almost completely solid Echogenicity is hypoechoic Ill-defined margins ACR TI-RADS risk category 4-6 Given size and appearance, a follow-up ultrasound in 1 year should be considered based on TI-RADS criteria --------------------------------------------------------------------------------------------------------------  Pathology Report: 10/10/19 NODULE 1: Clinical History: 3.4 cm LML  Specimen Submitted:  A. THYROID, LEFT MID, FINE NEEDLE ASPIRATION:   FINAL MICROSCOPIC DIAGNOSIS:  - Consistent with benign follicular nodule (Bethesda category II)  SPECIMEN ADEQUACY:  Satisfactory for evaluation   GROSS:  Received is/are 3 slides in 95% Ethyl alcohol, 3 air dried slides for  diff stain, and 30 ccs of pale pink Cytolyt solution. (CM:cm)  Prepared:  Smears:  6  Concentration Method (Thin Prep):  1  Cell Block:  Cell block attempted, not obtained.  Additional Studies:  Also there was an Afirma collected.  NODULE 2: Clinical History: 1.8 cm LUL  Specimen Submitted:  A. THYROID, LEFT SUPERIOR, FINE NEEDLE ASPIRATION:    FINAL MICROSCOPIC DIAGNOSIS:  - Consistent with benign follicular nodule (Bethesda category II)   SPECIMEN ADEQUACY:  Satisfactory for evaluation   GROSS:  Received is/are 3 slides in 95% Ethyl alcohol, 3 air dried slides for  diff stain, and 30 ccs of clear  colorless Cytolyt solution. (CM:cm)  Prepared:  Smears:  6  Concentration Method (Thin Prep):  1  Cell Block:  Cell block attempted, not obtained.  Additional Studies:  Also there was an Afirma collected.  ------------------------------------------------------------------------------------------------- Uptake and Scan results from 10/29/19 EXAM: THYROID SCAN AND UPTAKE - 4 AND 24 HOURS   TECHNIQUE: Following oral administration of I-123 capsule, anterior planar imaging was acquired at 24 hours. Thyroid uptake was calculated with a thyroid probe at 4-6 hours and 24 hours.   RADIOPHARMACEUTICALS:  460.0 uCi I-123 sodium iodide p.o.   COMPARISON:  Thyroid ultrasound 08/12/2019   FINDINGS: Minimal thyroid heterogeneity without focal hot or cold nodules.   4 hour I-123 uptake = 14.1% (normal 5-20%)   24 hour I-123 uptake = 26.0% (normal 10-30%)   IMPRESSION: 1. Normal radioactive iodine uptake at 4 and 24 hours. 2. Minimally heterogeneous appearance of the gland without focal hot or cold nodules. 3. Based on prior ultrasound appearance, findings could reflect subacute thyroiditis or mild/early toxic multinodular goiter.    ------------------------------------------------------------------------------------------------------------------------- Thyroid US from 10/21/20 CLINICAL DATA:  Subclinical hyperthyroidism, multinodular goiter. Previous FNA biopsy of mid left and superior left nodules 10/10/2019.   EXAM: THYROID ULTRASOUND   TECHNIQUE: Ultrasound examination of the thyroid gland and adjacent soft tissues was performed.   COMPARISON:  08/12/2019   FINDINGS: Parenchymal Echotexture: Markedly heterogenous   Isthmus: 0.2 cm thickness, previously 0.3   Right lobe: 4.7 x 2.2 x 1.9 cm, previously 4.6 x 1.7 x 2   Left lobe: 5.7 x 2.5 x 2.8 cm, previously 6 x 6 x 2.9 x 2.8   _________________________________________________________   Estimated total number of  nodules >/= 1 cm: 3   Number of spongiform nodules >/=  2 cm not described below (TR1): 0   Number of mixed cystic and solid nodules >/= 1.5 cm not described below ([place]): 0   _________________________________________________________   Nodule 1: 1.4 x 1.3 x 0.9 cm superior left, previously 1.8 x 1.4 x 1. This was previously biopsied.   Nodule 2: 3.1 x 2.3 x 2.1 cm mid left, previously 3.4 x 2.4 x 2.0; this was previously biopsied.   Nodule 3: 1.2 cm benign colloid cyst, inferior left, with no internal flow signal, previously 1.3. This nodule does NOT meet TI-RADS criteria for biopsy or dedicated follow-up.   IMPRESSION: 1. Heterogenous thyroid with stable left nodules. None currently meets criteria for biopsy or follow-up.   The above is in keeping with the ACR TI-RADS recommendations - J Am Coll Radiol 2017;14:587-595.     Electronically Signed   By: Lucrezia Europe M.D.   On: 10/21/2020 12:57    Repeat Uptake and scan from 12/18/20 CLINICAL  DATA:  Suppressed TSH, irritability for 1 year, TSH 0.058, question multinodular goiter   EXAM: THYROID SCAN AND UPTAKE - 4 AND 24 HOURS   TECHNIQUE: Following oral administration of I-123 capsule, anterior planar imaging was acquired at 24 hours. Thyroid uptake was calculated with a thyroid probe at 4-6 hours and 24 hours.   RADIOPHARMACEUTICALS:  424 uCi I-123 sodium iodide p.o.   COMPARISON:  Thyroid ultrasound 08/12/2019   FINDINGS: Asymmetric thyroid lobes LEFT larger than RIGHT.   Homogeneous tracer distribution in both thyroid lobes.   Tiny RIGHT upper lobe cold nodule.   No other focal areas of increased or decreased tracer uptake.   4 hour I-123 uptake = 18.9% (normal 5-20%)   24 hour I-123 uptake = 32.9% (normal 10-30%)   IMPRESSION: Homogeneous uptake in the thyroid lobes bilaterally with a single subtle cold nodule at the lateral aspect of the RIGHT lobe.   Elevated 24 hour radio iodine uptake consistent  with hyperthyroidism.   Overall findings are most consistent with Graves disease with a single tiny nonspecific RIGHT thyroid nodule.     Electronically Signed   By: Lavonia Dana M.D.   On: 12/18/2020 15:35    Latest Reference Range & Units 06/29/21 08:56 08/24/21 08:35 10/27/21 08:35 03/01/22 09:07  TSH 0.450 - 4.500 uIU/mL 7.110 (H) 3.520 1.800 2.090  Triiodothyronine,Free,Serum 2.0 - 4.4 pg/mL 3.0 3.1    T4,Free(Direct) 0.82 - 1.77 ng/dL 0.91 1.20 1.44 1.39  (H): Data is abnormally high  Assessment & Plan:   1. Multinodular goiter The biopsy of the two suspicious nodules show benign follicular nodules.  Her repeat thyroid ultrasound shows stable nodules, not requiring additional biopsy or dedicated follow up.    2. Hyperthyroidism- r/t Graves Disease -She is s/p RAI ablation for the second time on 01/15/21.    Her previsit thyroid function tests are consistent with appropriate hormone replacement.  She is advised to continue Levothyroxine 75 mcg po daily before breakfast.    - The correct intake of thyroid hormone (Levothyroxine, Synthroid), is on empty stomach first thing in the morning, with water, separated by at least 30 minutes from breakfast and other medications,  and separated by more than 4 hours from calcium, iron, multivitamins, acid reflux medications (PPIs).  - This medication is a life-long medication and will be needed to correct thyroid hormone imbalances for the rest of your life.  The dose may change from time to time, based on thyroid blood work.  - It is extremely important to be consistent taking this medication, near the same time each morning.  -AVOID TAKING PRODUCTS CONTAINING BIOTIN (commonly found in Hair, Skin, Nails vitamins) AS IT INTERFERES WITH THE VALIDITY OF THYROID FUNCTION BLOOD TESTS.     -Patient is advised to maintain close follow up with Zoe Blitz, MD for primary care needs.    I spent 18 minutes in the care of the patient today  including review of labs from Thyroid Function, CMP, and other relevant labs ; imaging/biopsy records (current and previous including abstractions from other facilities); face-to-face time discussing  her lab results and symptoms, medications doses, her options of short and long term treatment based on the latest standards of care / guidelines;   and documenting the encounter.  Zoe Webb  participated in the discussions, expressed understanding, and voiced agreement with the above plans.  All questions were answered to her satisfaction. she is encouraged to contact clinic should she have any questions or concerns prior  to her return visit.    Follow up plan: Return in about 4 months (around 07/06/2022) for Thyroid follow up, Previsit labs.     Ronny Bacon, [facility] [address] [place], [place] [address] Phone: [phone] Fax: [phone]   03/07/2022, 10:06 AM

## 2022-05-02 DIAGNOSIS — Z7189 Other specified counseling: Secondary | ICD-10-CM | POA: Diagnosis not present

## 2022-05-02 DIAGNOSIS — Z299 Encounter for prophylactic measures, unspecified: Secondary | ICD-10-CM | POA: Diagnosis not present

## 2022-05-02 DIAGNOSIS — Z0000 Encounter for general adult medical examination without abnormal findings: Secondary | ICD-10-CM | POA: Diagnosis not present

## 2022-05-02 DIAGNOSIS — I10 Essential (primary) hypertension: Secondary | ICD-10-CM | POA: Diagnosis not present

## 2022-05-02 DIAGNOSIS — E039 Hypothyroidism, unspecified: Secondary | ICD-10-CM | POA: Diagnosis not present

## 2022-05-02 DIAGNOSIS — Z5320 Procedure and treatment not carried out because of patient's decision for unspecified reasons: Secondary | ICD-10-CM | POA: Diagnosis not present

## 2022-06-08 DIAGNOSIS — H2513 Age-related nuclear cataract, bilateral: Secondary | ICD-10-CM | POA: Diagnosis not present

## 2022-07-05 ENCOUNTER — Other Ambulatory Visit: Payer: Self-pay | Admitting: Nurse Practitioner

## 2022-07-05 DIAGNOSIS — E890 Postprocedural hypothyroidism: Secondary | ICD-10-CM | POA: Diagnosis not present

## 2022-07-06 ENCOUNTER — Ambulatory Visit: Payer: Medicare Other | Admitting: Nurse Practitioner

## 2022-07-06 LAB — T4, FREE: Free T4: 1.47 ng/dL (ref 0.82–1.77)

## 2022-07-06 LAB — TSH: TSH: 2.02 u[IU]/mL (ref 0.450–4.500)

## 2022-07-11 NOTE — Patient Instructions (Signed)

## 2022-07-12 ENCOUNTER — Ambulatory Visit: Payer: Medicare Other | Admitting: Nurse Practitioner

## 2022-07-12 ENCOUNTER — Encounter: Payer: Self-pay | Admitting: Nurse Practitioner

## 2022-07-12 VITALS — BP 112/71 | HR 77 | Ht 65.0 in | Wt 202.0 lb

## 2022-07-12 DIAGNOSIS — E890 Postprocedural hypothyroidism: Secondary | ICD-10-CM

## 2022-07-12 MED ORDER — LEVOTHYROXINE SODIUM 75 MCG PO TABS
75.0000 ug | ORAL_TABLET | Freq: Every day | ORAL | 1 refills | Status: DC
Start: 2022-07-12 — End: 2022-11-28

## 2022-07-12 NOTE — Progress Notes (Signed)
07/12/2022     Endocrinology Follow Up Visit    Subjective:    Patient ID: Zoe Webb, female    DOB: 72-18-1952, PCP Kirstie Peri, MD.   Past Medical History:  Diagnosis Date   Hypertension    Osteopenia    Vitamin D deficiency     Past Surgical History:  Procedure Laterality Date   CYST REMOVAL NECK  74-75   TUBAL LIGATION  1979    Social History   Socioeconomic History   Marital status: Widowed    Spouse name: Not on file   Number of children: Not on file   Years of education: Not on file   Highest education level: Not on file  Occupational History   Not on file  Tobacco Use   Smoking status: Never   Smokeless tobacco: Never  Vaping Use   Vaping Use: Never used  Substance and Sexual Activity   Alcohol use: Not Currently   Drug use: Never   Sexual activity: Not Currently  Other Topics Concern   Not on file  Social History Narrative   Not on file   Social Determinants of Health   Financial Resource Strain: Not on file  Food Insecurity: Not on file  Transportation Needs: Not on file  Physical Activity: Not on file  Stress: Not on file  Social Connections: Not on file    Family History  Problem Relation Age of Onset   Cancer Father    Thyroid disease Sister    Breast cancer Neg Hx     Outpatient Encounter Medications as of 07/12/2022  Medication Sig   alendronate (FOSAMAX) 70 MG tablet Take 70 mg by mouth once a week.   lisinopril-hydrochlorothiazide (ZESTORETIC) 20-12.5 MG tablet Take 1 tablet by mouth daily.   Vitamin D, Ergocalciferol, (DRISDOL) 1.25 MG (50000 UNIT) CAPS capsule Take 50,000 Units by mouth once a week.   [DISCONTINUED] levothyroxine (SYNTHROID) 75 MCG tablet Take 1 tablet (75 mcg total) by mouth daily before breakfast.   levothyroxine (SYNTHROID) 75 MCG tablet Take 1 tablet (75 mcg total) by mouth daily before breakfast.   No facility-administered encounter medications on file as of 07/12/2022.    ALLERGIES: No  Known Allergies  VACCINATION STATUS:  There is no immunization history on file for this patient.   HPI  Zoe Webb is 72 y.o. female who presents today with a medical history as above. she is being seen in follow up after being seen in consultation for hyperthyroidism as requested by Kirstie Peri, MD.  she has been dealing with symptoms of fatigue, heat intolerance, weight gain, and alternating constipation/diarrhea for approximately 1 year.   Her recent thyroid function tests were reviewed.  she denies dysphagia, choking, shortness of breath, no recent voice change. She has no new complaints at this time and reports some improvements in some of her symptoms without intervention.   she does have a family history of thyroid dysfunction in her sister (hypothyroidism), but denies family hx of thyroid cancer. she denies personal history of goiter.   She is currently on Levothyroxine 75 mcg po daily for RAI induced hypothyroidism for toxic MNG.  Review of systems  Constitutional: + Minimally fluctuating body weight,  current Body mass index is 33.61 kg/m. , no fatigue, no subjective hyperthermia, no subjective hypothermia Eyes: no blurry vision, no xerophthalmia, + visual changes- has cataracts and corneal problem-seeing specialist ENT: no sore throat, no nodules palpated in throat, no dysphagia/odynophagia, no hoarseness Cardiovascular:  no chest pain, no shortness of breath, no palpitations, no leg swelling Respiratory: no cough, no shortness of breath Gastrointestinal: no nausea/vomiting/diarrhea Musculoskeletal: no muscle/joint aches Skin: no rashes, no hyperemia Neurological: no tremors, no numbness, no tingling, no dizziness Psychiatric: no depression, no anxiety   Objective:    BP 112/71 (BP Location: Left Arm, Patient Position: Sitting, Cuff Size: Large)   Pulse 77   Ht 5\' 5"  (1.651 m)   Wt 202 lb (91.6 kg)   BMI 33.61 kg/m   Wt Readings from Last 3 Encounters:   07/12/22 202 lb (91.6 kg)  03/07/22 201 lb 9.6 oz (91.4 kg)  11/03/21 204 lb 6.4 oz (92.7 kg)    BP Readings from Last 3 Encounters:  07/12/22 112/71  03/07/22 119/74  11/03/21 132/81     Physical Exam- Limited  Constitutional:  Body mass index is 33.61 kg/m. , not in acute distress, normal state of mind Eyes:  EOMI, no exophthalmos Musculoskeletal: no gross deformities, strength intact in all four extremities, no gross restriction of joint movements Skin:  no rashes, no hyperemia Neurological: no tremor with outstretched hands   CMP  No results found for: "NA", "K", "CL", "CO2", "GLUCOSE", "BUN", "CREATININE", "CALCIUM", "PROT", "ALBUMIN", "AST", "ALT", "ALKPHOS", "BILITOT", "GFRNONAA", "GFRAA"   CBC No results found for: "WBC", "RBC", "HGB", "HCT", "PLT", "MCV", "MCH", "MCHC", "RDW", "LYMPHSABS", "MONOABS", "EOSABS", "BASOSABS"   Diabetic Labs (most recent): No results found for: "HGBA1C", "MICROALBUR"  Lipid Panel  No results found for: "CHOL", "TRIG", "HDL", "CHOLHDL", "VLDL", "LDLCALC", "LDLDIRECT", "LABVLDL"   Lab Results  Component Value Date   TSH 2.020 07/05/2022   TSH 2.090 03/01/2022   TSH 1.800 10/27/2021   TSH 3.520 08/24/2021   TSH 7.110 (H) 06/29/2021   TSH 4.400 05/13/2021   TSH 2.410 03/18/2021   TSH 0.058 (L) 11/30/2020   TSH 1.390 08/18/2020   TSH 0.821 05/27/2020   FREET4 1.47 07/05/2022   FREET4 1.39 03/01/2022   FREET4 1.44 10/27/2021   FREET4 1.20 08/24/2021   FREET4 0.91 06/29/2021   FREET4 1.08 05/13/2021   FREET4 1.14 03/18/2021   FREET4 1.51 11/30/2020   FREET4 1.03 08/18/2020   FREET4 1.39 05/27/2020      Thyroid ultrasound performed on 08/12/2019 at [facility]:  Right lobe measuring 4.6 x 0.9 x 2.0 cm Left lobe measuring 6.6 x 2.9 x 2.8 cm Estimated total number of nodules greater than or equal to 1 cm: 3  Nodule 1: Location is in the left mid lobe measuring 3.4 x 2.0 x 2.4 cm Composition is solid/almost completely  solid Echogenicity is isoechoic Ill-defined margins ACR TI-RADS risk category 3 Given size and appearance, fine-needle aspiration of this mildly suspicious nodule should be considered based on TI-RADS criteria.  Nodule 2: Location is left superior lobe measuring 1.8 x 1 x 1.4 cm Composition is solid/almost completely solid Echogenicity is hypoechoic Ill-defined margins ACR TI-RADS risk category 4-6 Given size and appearance fine-needle aspiration of this moderately suspicious nodule should be considered based on TI-RADS criteria  Nodule 3: Location is left inferior lobe measuring 1.3 x 1 x 0.8 cm Composition is solid/almost completely solid Echogenicity is hypoechoic Ill-defined margins ACR TI-RADS risk category 4-6 Given size and appearance, a follow-up ultrasound in 1 year should be considered based on TI-RADS criteria --------------------------------------------------------------------------------------------------------------  Pathology Report: 10/10/19 NODULE 1: Clinical History: 3.4 cm LML  Specimen Submitted:  A. THYROID, LEFT MID, FINE NEEDLE ASPIRATION:   FINAL MICROSCOPIC DIAGNOSIS:  - Consistent with benign follicular  nodule (Bethesda category II)   SPECIMEN ADEQUACY:  Satisfactory for evaluation   GROSS:  Received is/are 3 slides in 95% Ethyl alcohol, 3 air dried slides for  diff stain, and 30 ccs of pale pink Cytolyt solution. (CM:cm)  Prepared:  Smears:  6  Concentration Method (Thin Prep):  1  Cell Block:  Cell block attempted, not obtained.  Additional Studies:  Also there was an Afirma collected.  NODULE 2: Clinical History: 1.8 cm LUL  Specimen Submitted:  A. THYROID, LEFT SUPERIOR, FINE NEEDLE ASPIRATION:    FINAL MICROSCOPIC DIAGNOSIS:  - Consistent with benign follicular nodule (Bethesda category II)   SPECIMEN ADEQUACY:  Satisfactory for evaluation   GROSS:  Received is/are 3 slides in 95% Ethyl alcohol, 3 air dried slides for  diff stain, and  30 ccs of clear colorless Cytolyt solution. (CM:cm)  Prepared:  Smears:  6  Concentration Method (Thin Prep):  1  Cell Block:  Cell block attempted, not obtained.  Additional Studies:  Also there was an Afirma collected.  ------------------------------------------------------------------------------------------------- Uptake and Scan results from 10/29/19 EXAM: THYROID SCAN AND UPTAKE - 4 AND 24 HOURS   TECHNIQUE: Following oral administration of I-123 capsule, anterior planar imaging was acquired at 24 hours. Thyroid uptake was calculated with a thyroid probe at 4-6 hours and 24 hours.   RADIOPHARMACEUTICALS:  460.0 uCi I-123 sodium iodide p.o.   COMPARISON:  Thyroid ultrasound 08/12/2019   FINDINGS: Minimal thyroid heterogeneity without focal hot or cold nodules.   4 hour I-123 uptake = 14.1% (normal 5-20%)   24 hour I-123 uptake = 26.0% (normal 10-30%)   IMPRESSION: 1. Normal radioactive iodine uptake at 4 and 24 hours. 2. Minimally heterogeneous appearance of the gland without focal hot or cold nodules. 3. Based on prior ultrasound appearance, findings could reflect subacute thyroiditis or mild/early toxic multinodular goiter.    ------------------------------------------------------------------------------------------------------------------------- Thyroid US from 10/21/20 CLINICAL DATA:  Subclinical hyperthyroidism, multinodular goiter. Previous FNA biopsy of mid left and superior left nodules 10/10/2019.   EXAM: THYROID ULTRASOUND   TECHNIQUE: Ultrasound examination of the thyroid gland and adjacent soft tissues was performed.   COMPARISON:  08/12/2019   FINDINGS: Parenchymal Echotexture: Markedly heterogenous   Isthmus: 0.2 cm thickness, previously 0.3   Right lobe: 4.7 x 2.2 x 1.9 cm, previously 4.6 x 1.7 x 2   Left lobe: 5.7 x 2.5 x 2.8 cm, previously 6 x 6 x 2.9 x 2.8   _________________________________________________________   Estimated total  number of nodules >/= 1 cm: 3   Number of spongiform nodules >/=  2 cm not described below (TR1): 0   Number of mixed cystic and solid nodules >/= 1.5 cm not described below (TR2): 0   _________________________________________________________   Nodule 1: 1.4 x 1.3 x 0.9 cm superior left, previously 1.8 x 1.4 x 1. This was previously biopsied.   Nodule 2: 3.1 x 2.3 x 2.1 cm mid left, previously 3.4 x 2.4 x 2.0; this was previously biopsied.   Nodule 3: 1.2 cm benign colloid cyst, inferior left, with no internal flow signal, previously 1.3. This nodule does NOT meet TI-RADS criteria for biopsy or dedicated follow-up.   IMPRESSION: 1. Heterogenous thyroid with stable left nodules. None currently meets criteria for biopsy or follow-up.   The above is in keeping with the ACR TI-RADS recommendations - J Am Coll Radiol 2017;14:587-595.     Electronically Signed   By: Corlis Leak M.D.   On: 10/21/2020 12:57    Repeat  Uptake and scan from 12/18/20 CLINICAL DATA:  Suppressed TSH, irritability for 1 year, TSH 0.058, question multinodular goiter   EXAM: THYROID SCAN AND UPTAKE - 4 AND 24 HOURS   TECHNIQUE: Following oral administration of I-123 capsule, anterior planar imaging was acquired at 24 hours. Thyroid uptake was calculated with a thyroid probe at 4-6 hours and 24 hours.   RADIOPHARMACEUTICALS:  424 uCi I-123 sodium iodide p.o.   COMPARISON:  Thyroid ultrasound 08/12/2019   FINDINGS: Asymmetric thyroid lobes LEFT larger than RIGHT.   Homogeneous tracer distribution in both thyroid lobes.   Tiny RIGHT upper lobe cold nodule.   No other focal areas of increased or decreased tracer uptake.   4 hour I-123 uptake = 18.9% (normal 5-20%)   24 hour I-123 uptake = 32.9% (normal 10-30%)   IMPRESSION: Homogeneous uptake in the thyroid lobes bilaterally with a single subtle cold nodule at the lateral aspect of the RIGHT lobe.   Elevated 24 hour radio iodine uptake  consistent with hyperthyroidism.   Overall findings are most consistent with Graves disease with a single tiny nonspecific RIGHT thyroid nodule.     Electronically Signed   By: Ulyses Southward M.D.   On: 12/18/2020 15:35    Latest Reference Range & Units 08/24/21 08:35 10/27/21 08:35 03/01/22 09:07 07/05/22 08:09  TSH 0.450 - 4.500 uIU/mL 3.520 1.800 2.090 2.020  Triiodothyronine,Free,Serum 2.0 - 4.4 pg/mL 3.1     T4,Free(Direct) 0.82 - 1.77 ng/dL 1.61 0.96 0.45 4.09    Assessment & Plan:   1. Multinodular goiter The biopsy of the two suspicious nodules show benign follicular nodules.  Her repeat thyroid ultrasound shows stable nodules, not requiring additional biopsy or dedicated follow up.    2. Hyperthyroidism- r/t Graves Disease -She is s/p RAI ablation for the second time on 01/15/21.    Her previsit thyroid function tests are consistent with appropriate hormone replacement.  She is advised to continue Levothyroxine 75 mcg po daily before breakfast.    - The correct intake of thyroid hormone (Levothyroxine, Synthroid), is on empty stomach first thing in the morning, with water, separated by at least 30 minutes from breakfast and other medications,  and separated by more than 4 hours from calcium, iron, multivitamins, acid reflux medications (PPIs).  - This medication is a life-long medication and will be needed to correct thyroid hormone imbalances for the rest of your life.  The dose may change from time to time, based on thyroid blood work.  - It is extremely important to be consistent taking this medication, near the same time each morning.  -AVOID TAKING PRODUCTS CONTAINING BIOTIN (commonly found in Hair, Skin, Nails vitamins) AS IT INTERFERES WITH THE VALIDITY OF THYROID FUNCTION BLOOD TESTS.     -Patient is advised to maintain close follow up with Kirstie Peri, MD for primary care needs.    I spent  24  minutes in the care of the patient today including review of labs  from Thyroid Function, CMP, and other relevant labs ; imaging/biopsy records (current and previous including abstractions from other facilities); face-to-face time discussing  her lab results and symptoms, medications doses, her options of short and long term treatment based on the latest standards of care / guidelines;   and documenting the encounter.  Zoe Webb  participated in the discussions, expressed understanding, and voiced agreement with the above plans.  All questions were answered to her satisfaction. she is encouraged to contact clinic should she have any questions  or concerns prior to her return visit.    Follow up plan: Return in about 6 months (around 01/11/2023) for Thyroid follow up, Previsit labs.     Ronny Bacon, [facility] [address] [place], [place] [address] Phone: [phone] Fax: [phone]   07/12/2022, 11:43 AM

## 2022-07-27 DIAGNOSIS — H2513 Age-related nuclear cataract, bilateral: Secondary | ICD-10-CM | POA: Diagnosis not present

## 2022-09-16 DIAGNOSIS — H2512 Age-related nuclear cataract, left eye: Secondary | ICD-10-CM | POA: Diagnosis not present

## 2022-09-29 DIAGNOSIS — H2512 Age-related nuclear cataract, left eye: Secondary | ICD-10-CM | POA: Diagnosis not present

## 2022-10-11 DIAGNOSIS — H2511 Age-related nuclear cataract, right eye: Secondary | ICD-10-CM | POA: Diagnosis not present

## 2022-10-11 DIAGNOSIS — Z947 Corneal transplant status: Secondary | ICD-10-CM | POA: Diagnosis not present

## 2022-10-11 DIAGNOSIS — Z961 Presence of intraocular lens: Secondary | ICD-10-CM | POA: Diagnosis not present

## 2022-10-11 DIAGNOSIS — Z9842 Cataract extraction status, left eye: Secondary | ICD-10-CM | POA: Diagnosis not present

## 2022-11-26 ENCOUNTER — Other Ambulatory Visit: Payer: Self-pay | Admitting: Nurse Practitioner

## 2022-11-26 DIAGNOSIS — E890 Postprocedural hypothyroidism: Secondary | ICD-10-CM

## 2022-12-02 DIAGNOSIS — H26492 Other secondary cataract, left eye: Secondary | ICD-10-CM | POA: Diagnosis not present

## 2023-01-09 DIAGNOSIS — E890 Postprocedural hypothyroidism: Secondary | ICD-10-CM | POA: Diagnosis not present

## 2023-01-10 LAB — T4, FREE: Free T4: 1.43 ng/dL (ref 0.82–1.77)

## 2023-01-10 LAB — TSH: TSH: 2.47 u[IU]/mL (ref 0.450–4.500)

## 2023-01-11 NOTE — Patient Instructions (Signed)

## 2023-01-13 ENCOUNTER — Ambulatory Visit: Payer: Medicare Other | Admitting: Nurse Practitioner

## 2023-01-13 ENCOUNTER — Encounter: Payer: Self-pay | Admitting: Nurse Practitioner

## 2023-01-13 VITALS — BP 132/84 | HR 85 | Ht 65.0 in | Wt 199.4 lb

## 2023-01-13 DIAGNOSIS — E042 Nontoxic multinodular goiter: Secondary | ICD-10-CM

## 2023-01-13 DIAGNOSIS — E890 Postprocedural hypothyroidism: Secondary | ICD-10-CM

## 2023-01-13 MED ORDER — LEVOTHYROXINE SODIUM 75 MCG PO TABS
75.0000 ug | ORAL_TABLET | Freq: Every day | ORAL | 3 refills | Status: DC
Start: 2023-01-13 — End: 2023-05-29

## 2023-01-13 NOTE — Progress Notes (Signed)
01/13/2023     Endocrinology Follow Up Visit    Subjective:    Patient ID: Zoe Webb, female    DOB: 1950/10/24, PCP Zoe Peri, MD.   Past Medical History:  Diagnosis Date   Hypertension    Osteopenia    Vitamin D deficiency     Past Surgical History:  Procedure Laterality Date   CYST REMOVAL NECK  74-75   TUBAL LIGATION  1979    Social History   Socioeconomic History   Marital status: Widowed    Spouse name: Not on file   Number of children: Not on file   Years of education: Not on file   Highest education level: Not on file  Occupational History   Not on file  Tobacco Use   Smoking status: Never   Smokeless tobacco: Never  Vaping Use   Vaping status: Never Used  Substance and Sexual Activity   Alcohol use: Not Currently   Drug use: Never   Sexual activity: Not Currently  Other Topics Concern   Not on file  Social History Narrative   Not on file   Social Determinants of Health   Financial Resource Strain: Not on file  Food Insecurity: Not on file  Transportation Needs: Not on file  Physical Activity: Not on file  Stress: Not on file  Social Connections: Not on file    Family History  Problem Relation Age of Onset   Cancer Father    Thyroid disease Sister    Breast cancer Neg Hx     Outpatient Encounter Medications as of 01/13/2023  Medication Sig   alendronate (FOSAMAX) 70 MG tablet Take 70 mg by mouth once a week.   lisinopril-hydrochlorothiazide (ZESTORETIC) 20-12.5 MG tablet Take 1 tablet by mouth daily.   Vitamin D, Ergocalciferol, (DRISDOL) 1.25 MG (50000 UNIT) CAPS capsule Take 50,000 Units by mouth once a week.   [DISCONTINUED] levothyroxine (SYNTHROID) 75 MCG tablet TAKE 1 TABLET(75 MCG) BY MOUTH DAILY BEFORE BREAKFAST   levothyroxine (SYNTHROID) 75 MCG tablet Take 1 tablet (75 mcg total) by mouth daily before breakfast.   No facility-administered encounter medications on file as of 01/13/2023.    ALLERGIES: No  Known Allergies  VACCINATION STATUS:  There is no immunization history on file for this patient.   HPI  Zoe Webb is 72 y.o. female who presents today with a medical history as above. she is being seen in follow up after being seen in consultation for hyperthyroidism as requested by Zoe Peri, MD.  she has been dealing with symptoms of fatigue, heat intolerance, weight gain, and alternating constipation/diarrhea for approximately 1 year.   Her recent thyroid function tests were reviewed.  she denies dysphagia, choking, shortness of breath, no recent voice change. She has no new complaints at this time and reports some improvements in some of her symptoms without intervention.   she does have a family history of thyroid dysfunction in her sister (hypothyroidism), but denies family hx of thyroid cancer. she denies personal history of goiter.   She is currently on Levothyroxine 75 mcg po daily for RAI induced hypothyroidism for toxic MNG.  Review of systems  Constitutional: + Minimally fluctuating body weight,  current Body mass index is 33.18 kg/m. , no fatigue, no subjective hyperthermia, no subjective hypothermia Eyes: + blurry vision- having cataract/corneal transplant on her opposite eye soon, no xerophthalmia ENT: no sore throat, no nodules palpated in throat, no dysphagia/odynophagia, no hoarseness Cardiovascular: no chest pain,  no shortness of breath, no palpitations, no leg swelling Respiratory: no cough, no shortness of breath Gastrointestinal: no nausea/vomiting/diarrhea Musculoskeletal: no muscle/joint aches Skin: no rashes, no hyperemia Neurological: no tremors, no numbness, no tingling, no dizziness Psychiatric: no depression, no anxiety, intermittent irritability   Objective:    BP 132/84 (BP Location: Right Arm, Patient Position: Sitting, Cuff Size: Large)   Pulse 85   Ht 5\' 5"  (1.651 m)   Wt 199 lb 6.4 oz (90.4 kg)   BMI 33.18 kg/m   Wt Readings from  Last 3 Encounters:  01/13/23 199 lb 6.4 oz (90.4 kg)  07/12/22 202 lb (91.6 kg)  03/07/22 201 lb 9.6 oz (91.4 kg)    BP Readings from Last 3 Encounters:  01/13/23 132/84  07/12/22 112/71  03/07/22 119/74     Physical Exam- Limited  Constitutional:  Body mass index is 33.18 kg/m. , not in acute distress, normal state of mind Eyes:  EOMI, no exophthalmos Musculoskeletal: no gross deformities, strength intact in all four extremities, no gross restriction of joint movements Skin:  no rashes, no hyperemia Neurological: no tremor with outstretched hands   CMP  No results found for: "NA", "K", "CL", "CO2", "GLUCOSE", "BUN", "CREATININE", "CALCIUM", "PROT", "ALBUMIN", "AST", "ALT", "ALKPHOS", "BILITOT", "GFRNONAA", "GFRAA"   CBC No results found for: "WBC", "RBC", "HGB", "HCT", "PLT", "MCV", "MCH", "MCHC", "RDW", "LYMPHSABS", "MONOABS", "EOSABS", "BASOSABS"   Diabetic Labs (most recent): No results found for: "HGBA1C", "MICROALBUR"  Lipid Panel  No results found for: "CHOL", "TRIG", "HDL", "CHOLHDL", "VLDL", "LDLCALC", "LDLDIRECT", "LABVLDL"   Lab Results  Component Value Date   TSH 2.470 01/09/2023   TSH 2.020 07/05/2022   TSH 2.090 03/01/2022   TSH 1.800 10/27/2021   TSH 3.520 08/24/2021   TSH 7.110 (H) 06/29/2021   TSH 4.400 05/13/2021   TSH 2.410 03/18/2021   TSH 0.058 (L) 11/30/2020   TSH 1.390 08/18/2020   FREET4 1.43 01/09/2023   FREET4 1.47 07/05/2022   FREET4 1.39 03/01/2022   FREET4 1.44 10/27/2021   FREET4 1.20 08/24/2021   FREET4 0.91 06/29/2021   FREET4 1.08 05/13/2021   FREET4 1.14 03/18/2021   FREET4 1.51 11/30/2020   FREET4 1.03 08/18/2020      Thyroid ultrasound performed on 08/12/2019 at [facility]:  Right lobe measuring 4.6 x 0.9 x 2.0 cm Left lobe measuring 6.6 x 2.9 x 2.8 cm Estimated total number of nodules greater than or equal to 1 cm: 3  Nodule 1: Location is in the left mid lobe measuring 3.4 x 2.0 x 2.4 cm Composition is  solid/almost completely solid Echogenicity is isoechoic Ill-defined margins ACR TI-RADS risk category 3 Given size and appearance, fine-needle aspiration of this mildly suspicious nodule should be considered based on TI-RADS criteria.  Nodule 2: Location is left superior lobe measuring 1.8 x 1 x 1.4 cm Composition is solid/almost completely solid Echogenicity is hypoechoic Ill-defined margins ACR TI-RADS risk category 4-6 Given size and appearance fine-needle aspiration of this moderately suspicious nodule should be considered based on TI-RADS criteria  Nodule 3: Location is left inferior lobe measuring 1.3 x 1 x 0.8 cm Composition is solid/almost completely solid Echogenicity is hypoechoic Ill-defined margins ACR TI-RADS risk category 4-6 Given size and appearance, a follow-up ultrasound in 1 year should be considered based on TI-RADS criteria --------------------------------------------------------------------------------------------------------------  Pathology Report: 10/10/19 NODULE 1: Clinical History: 3.4 cm LML  Specimen Submitted:  A. THYROID, LEFT MID, FINE NEEDLE ASPIRATION:   FINAL MICROSCOPIC DIAGNOSIS:  - Consistent with benign  follicular nodule (Bethesda category II)   SPECIMEN ADEQUACY:  Satisfactory for evaluation   GROSS:  Received is/are 3 slides in 95% Ethyl alcohol, 3 air dried slides for  diff stain, and 30 ccs of pale pink Cytolyt solution. (CM:cm)  Prepared:  Smears:  6  Concentration Method (Thin Prep):  1  Cell Block:  Cell block attempted, not obtained.  Additional Studies:  Also there was an Afirma collected.  NODULE 2: Clinical History: 1.8 cm LUL  Specimen Submitted:  A. THYROID, LEFT SUPERIOR, FINE NEEDLE ASPIRATION:    FINAL MICROSCOPIC DIAGNOSIS:  - Consistent with benign follicular nodule (Bethesda category II)   SPECIMEN ADEQUACY:  Satisfactory for evaluation   GROSS:  Received is/are 3 slides in 95% Ethyl alcohol, 3 air dried  slides for  diff stain, and 30 ccs of clear colorless Cytolyt solution. (CM:cm)  Prepared:  Smears:  6  Concentration Method (Thin Prep):  1  Cell Block:  Cell block attempted, not obtained.  Additional Studies:  Also there was an Afirma collected.  ------------------------------------------------------------------------------------------------- Uptake and Scan results from 10/29/19 EXAM: THYROID SCAN AND UPTAKE - 4 AND 24 HOURS   TECHNIQUE: Following oral administration of I-123 capsule, anterior planar imaging was acquired at 24 hours. Thyroid uptake was calculated with a thyroid probe at 4-6 hours and 24 hours.   RADIOPHARMACEUTICALS:  460.0 uCi I-123 sodium iodide p.o.   COMPARISON:  Thyroid ultrasound 08/12/2019   FINDINGS: Minimal thyroid heterogeneity without focal hot or cold nodules.   4 hour I-123 uptake = 14.1% (normal 5-20%)   24 hour I-123 uptake = 26.0% (normal 10-30%)   IMPRESSION: 1. Normal radioactive iodine uptake at 4 and 24 hours. 2. Minimally heterogeneous appearance of the gland without focal hot or cold nodules. 3. Based on prior ultrasound appearance, findings could reflect subacute thyroiditis or mild/early toxic multinodular goiter.    ------------------------------------------------------------------------------------------------------------------------- Thyroid US from 10/21/20 CLINICAL DATA:  Subclinical hyperthyroidism, multinodular goiter. Previous FNA biopsy of mid left and superior left nodules 10/10/2019.   EXAM: THYROID ULTRASOUND   TECHNIQUE: Ultrasound examination of the thyroid gland and adjacent soft tissues was performed.   COMPARISON:  08/12/2019   FINDINGS: Parenchymal Echotexture: Markedly heterogenous   Isthmus: 0.2 cm thickness, previously 0.3   Right lobe: 4.7 x 2.2 x 1.9 cm, previously 4.6 x 1.7 x 2   Left lobe: 5.7 x 2.5 x 2.8 cm, previously 6 x 6 x 2.9 x 2.8    _________________________________________________________   Estimated total number of nodules >/= 1 cm: 3   Number of spongiform nodules >/=  2 cm not described below (TR1): 0   Number of mixed cystic and solid nodules >/= 1.5 cm not described below (TR2): 0   _________________________________________________________   Nodule 1: 1.4 x 1.3 x 0.9 cm superior left, previously 1.8 x 1.4 x 1. This was previously biopsied.   Nodule 2: 3.1 x 2.3 x 2.1 cm mid left, previously 3.4 x 2.4 x 2.0; this was previously biopsied.   Nodule 3: 1.2 cm benign colloid cyst, inferior left, with no internal flow signal, previously 1.3. This nodule does NOT meet TI-RADS criteria for biopsy or dedicated follow-up.   IMPRESSION: 1. Heterogenous thyroid with stable left nodules. None currently meets criteria for biopsy or follow-up.   The above is in keeping with the ACR TI-RADS recommendations - J Am Coll Radiol 2017;14:587-595.     Electronically Signed   By: Corlis Leak M.D.   On: 10/21/2020 12:57  Repeat Uptake and scan from 12/18/20 CLINICAL DATA:  Suppressed TSH, irritability for 1 year, TSH 0.058, question multinodular goiter   EXAM: THYROID SCAN AND UPTAKE - 4 AND 24 HOURS   TECHNIQUE: Following oral administration of I-123 capsule, anterior planar imaging was acquired at 24 hours. Thyroid uptake was calculated with a thyroid probe at 4-6 hours and 24 hours.   RADIOPHARMACEUTICALS:  424 uCi I-123 sodium iodide p.o.   COMPARISON:  Thyroid ultrasound 08/12/2019   FINDINGS: Asymmetric thyroid lobes LEFT larger than RIGHT.   Homogeneous tracer distribution in both thyroid lobes.   Tiny RIGHT upper lobe cold nodule.   No other focal areas of increased or decreased tracer uptake.   4 hour I-123 uptake = 18.9% (normal 5-20%)   24 hour I-123 uptake = 32.9% (normal 10-30%)   IMPRESSION: Homogeneous uptake in the thyroid lobes bilaterally with a single subtle cold nodule at  the lateral aspect of the RIGHT lobe.   Elevated 24 hour radio iodine uptake consistent with hyperthyroidism.   Overall findings are most consistent with Graves disease with a single tiny nonspecific RIGHT thyroid nodule.     Electronically Signed   By: Ulyses Southward M.D.   On: 12/18/2020 15:35    Latest Reference Range & Units 06/29/21 08:56 08/24/21 08:35 10/27/21 08:35 03/01/22 09:07 07/05/22 08:09 01/09/23 09:57  TSH 0.450 - 4.500 uIU/mL 7.110 (H) 3.520 1.800 2.090 2.020 2.470  Triiodothyronine,Free,Serum 2.0 - 4.4 pg/mL 3.0 3.1      T4,Free(Direct) 0.82 - 1.77 ng/dL 5.62 1.30 8.65 7.84 6.96 1.43  (H): Data is abnormally high   Assessment & Plan:   1. Multinodular goiter The biopsy of the two suspicious nodules show benign follicular nodules.  Her repeat thyroid ultrasound shows stable nodules, not requiring additional biopsy or dedicated follow up.    2. Hyperthyroidism- r/t Graves Disease -She is s/p RAI ablation for the second time on 01/15/21.    Her previsit thyroid function tests are consistent with appropriate hormone replacement.  She is advised to continue Levothyroxine 75 mcg po daily before breakfast.    - The correct intake of thyroid hormone (Levothyroxine, Synthroid), is on empty stomach first thing in the morning, with water, separated by at least 30 minutes from breakfast and other medications,  and separated by more than 4 hours from calcium, iron, multivitamins, acid reflux medications (PPIs).  - This medication is a life-long medication and will be needed to correct thyroid hormone imbalances for the rest of your life.  The dose may change from time to time, based on thyroid blood work.  - It is extremely important to be consistent taking this medication, near the same time each morning.  -AVOID TAKING PRODUCTS CONTAINING BIOTIN (commonly found in Hair, Skin, Nails vitamins) AS IT INTERFERES WITH THE VALIDITY OF THYROID FUNCTION BLOOD TESTS.     -Patient  is advised to maintain close follow up with Zoe Peri, MD for primary care needs.    I spent  12  minutes in the care of the patient today including review of labs from Thyroid Function, CMP, and other relevant labs ; imaging/biopsy records (current and previous including abstractions from other facilities); face-to-face time discussing  her lab results and symptoms, medications doses, her options of short and long term treatment based on the latest standards of care / guidelines;   and documenting the encounter.  Zoe Webb  participated in the discussions, expressed understanding, and voiced agreement with the above plans.  All  questions were answered to her satisfaction. she is encouraged to contact clinic should she have any questions or concerns prior to her return visit.    Follow up plan: Return in about 1 year (around 01/13/2024) for Thyroid follow up, Previsit labs.     Ronny Bacon, [facility] [address] [place], [place] [address] Phone: [phone] Fax: [phone]   01/13/2023, 10:32 AM

## 2023-04-26 DIAGNOSIS — Z9842 Cataract extraction status, left eye: Secondary | ICD-10-CM | POA: Diagnosis not present

## 2023-04-26 DIAGNOSIS — Z961 Presence of intraocular lens: Secondary | ICD-10-CM | POA: Diagnosis not present

## 2023-04-26 DIAGNOSIS — H2511 Age-related nuclear cataract, right eye: Secondary | ICD-10-CM | POA: Diagnosis not present

## 2023-04-26 DIAGNOSIS — Z947 Corneal transplant status: Secondary | ICD-10-CM | POA: Diagnosis not present

## 2023-05-03 DIAGNOSIS — Z299 Encounter for prophylactic measures, unspecified: Secondary | ICD-10-CM | POA: Diagnosis not present

## 2023-05-03 DIAGNOSIS — E039 Hypothyroidism, unspecified: Secondary | ICD-10-CM | POA: Diagnosis not present

## 2023-05-03 DIAGNOSIS — Z79899 Other long term (current) drug therapy: Secondary | ICD-10-CM | POA: Diagnosis not present

## 2023-05-03 DIAGNOSIS — I10 Essential (primary) hypertension: Secondary | ICD-10-CM | POA: Diagnosis not present

## 2023-05-03 DIAGNOSIS — E7800 Pure hypercholesterolemia, unspecified: Secondary | ICD-10-CM | POA: Diagnosis not present

## 2023-05-03 DIAGNOSIS — Z7189 Other specified counseling: Secondary | ICD-10-CM | POA: Diagnosis not present

## 2023-05-03 DIAGNOSIS — R5383 Other fatigue: Secondary | ICD-10-CM | POA: Diagnosis not present

## 2023-05-03 DIAGNOSIS — Z0000 Encounter for general adult medical examination without abnormal findings: Secondary | ICD-10-CM | POA: Diagnosis not present

## 2023-05-26 ENCOUNTER — Other Ambulatory Visit: Payer: Self-pay | Admitting: Nurse Practitioner

## 2023-05-26 DIAGNOSIS — E890 Postprocedural hypothyroidism: Secondary | ICD-10-CM

## 2023-09-01 DIAGNOSIS — H2511 Age-related nuclear cataract, right eye: Secondary | ICD-10-CM | POA: Diagnosis not present

## 2023-09-01 DIAGNOSIS — Z9842 Cataract extraction status, left eye: Secondary | ICD-10-CM | POA: Diagnosis not present

## 2023-09-01 DIAGNOSIS — Z961 Presence of intraocular lens: Secondary | ICD-10-CM | POA: Diagnosis not present

## 2023-09-01 DIAGNOSIS — Z947 Corneal transplant status: Secondary | ICD-10-CM | POA: Diagnosis not present

## 2023-11-20 ENCOUNTER — Telehealth: Payer: Self-pay | Admitting: *Deleted

## 2023-11-20 ENCOUNTER — Other Ambulatory Visit: Payer: Self-pay | Admitting: Nurse Practitioner

## 2023-11-20 DIAGNOSIS — E890 Postprocedural hypothyroidism: Secondary | ICD-10-CM

## 2023-11-20 NOTE — Telephone Encounter (Signed)
 Yes, she can go ahead and get labs done (I entered the orders), will reach out to her with the results once they are back.

## 2023-11-20 NOTE — Telephone Encounter (Signed)
 Patient's call was returned. She shares that she would like to have labs drawn. She feels that her thyroid  levels are messed up by the way that she is feeling. I ask her what symptoms was she having. She shared that she was tired , irritable and occasionally she is experiencing flutters. Patient was advised that once this was addressed by Benton we would call her back to let her know.

## 2023-11-20 NOTE — Telephone Encounter (Signed)
 Patient was called and a message was left with [name]'s approval to move forward with Lab orders.

## 2023-11-21 DIAGNOSIS — H2511 Age-related nuclear cataract, right eye: Secondary | ICD-10-CM | POA: Diagnosis not present

## 2023-11-21 DIAGNOSIS — E890 Postprocedural hypothyroidism: Secondary | ICD-10-CM | POA: Diagnosis not present

## 2023-11-22 ENCOUNTER — Ambulatory Visit: Payer: Self-pay | Admitting: Nurse Practitioner

## 2023-11-22 LAB — TSH: TSH: 2.99 u[IU]/mL (ref 0.450–4.500)

## 2023-11-22 LAB — T4, FREE: Free T4: 1.32 ng/dL (ref 0.82–1.77)

## 2023-11-22 NOTE — Progress Notes (Signed)
 FYI: I sent mychart message going over thyroid labs.

## 2023-11-22 NOTE — Progress Notes (Signed)
 Noted a message was sent to the patient going over results.

## 2023-11-30 DIAGNOSIS — H25811 Combined forms of age-related cataract, right eye: Secondary | ICD-10-CM | POA: Diagnosis not present

## 2023-11-30 DIAGNOSIS — I10 Essential (primary) hypertension: Secondary | ICD-10-CM | POA: Diagnosis not present

## 2023-11-30 DIAGNOSIS — H2511 Age-related nuclear cataract, right eye: Secondary | ICD-10-CM | POA: Diagnosis not present

## 2023-11-30 DIAGNOSIS — E890 Postprocedural hypothyroidism: Secondary | ICD-10-CM | POA: Diagnosis not present

## 2023-11-30 DIAGNOSIS — Z79899 Other long term (current) drug therapy: Secondary | ICD-10-CM | POA: Diagnosis not present

## 2023-12-06 ENCOUNTER — Telehealth: Payer: Self-pay | Admitting: *Deleted

## 2023-12-06 NOTE — Telephone Encounter (Signed)
 Patient was called and reviewed results with her per Benton Rio, NP instruction.

## 2024-01-19 ENCOUNTER — Ambulatory Visit: Payer: Medicare Other | Admitting: Nurse Practitioner

## 2024-02-20 ENCOUNTER — Other Ambulatory Visit: Payer: Self-pay | Admitting: Nurse Practitioner

## 2024-02-20 DIAGNOSIS — E890 Postprocedural hypothyroidism: Secondary | ICD-10-CM

## 2024-02-21 ENCOUNTER — Other Ambulatory Visit: Payer: Self-pay | Admitting: *Deleted

## 2024-02-21 ENCOUNTER — Telehealth: Payer: Self-pay | Admitting: *Deleted

## 2024-02-21 DIAGNOSIS — E890 Postprocedural hypothyroidism: Secondary | ICD-10-CM

## 2024-02-21 MED ORDER — LEVOTHYROXINE SODIUM 75 MCG PO TABS: 75.0000 ug | ORAL_TABLET | Freq: Every day | ORAL | 0 refills | Status: AC

## 2024-02-21 NOTE — Telephone Encounter (Signed)
 Looks like she canceled an appointment because I had gone over labs with her.  She certainly needs to get back in for a follow up but we can give her a limited supply of her meds once she schedules that follow up.

## 2024-02-21 NOTE — Telephone Encounter (Signed)
 Patient called and said that the pharmacy had told her that her Thyroid   medication had been denied. The reason that it has been denied is the patient has not been seen in the office since 01/13/2023. There has been messages such as a lab order had been placed, ect. At this time there are no future appointments.

## 2024-02-21 NOTE — Telephone Encounter (Signed)
 Please call the patient and set up appointment,Zoe Webb states that we need to get her back on the books. She was last seen 01/13/2023. Patient was called and a message was left letting her know this, and that we had sent in a RX for a 90 supply.

## 2024-02-21 NOTE — Telephone Encounter (Signed)
 Per Benton may give the patient a refill. Patient needs to get back on the books for appointment, it was 01/13/2023 that she was last seen. Patient called and made aware and a Rx was sent in.

## 2024-02-21 NOTE — Telephone Encounter (Signed)
Labs need to be placed

## 2024-02-22 NOTE — Addendum Note (Signed)
 Addended by: Velencia Lenart J on: 02/22/2024 06:46 AM   Modules accepted: Orders

## 2024-02-22 NOTE — Telephone Encounter (Signed)
 done

## 2024-05-23 ENCOUNTER — Ambulatory Visit: Admitting: Nurse Practitioner
